# Patient Record
Sex: Female | Born: 1965 | Race: White | Hispanic: No | Marital: Married | Smoking: Current every day smoker
Health system: Southern US, Community
[De-identification: ages and names within clinical notes are randomized; demographics above are authoritative.]

## PROBLEM LIST (undated history)

## (undated) DIAGNOSIS — M539 Dorsopathy, unspecified: Secondary | ICD-10-CM

## (undated) DIAGNOSIS — M318 Other specified necrotizing vasculopathies: Secondary | ICD-10-CM

## (undated) DIAGNOSIS — T5691XA Toxic effect of unspecified metal, accidental (unintentional), initial encounter: Secondary | ICD-10-CM

## (undated) DIAGNOSIS — M797 Fibromyalgia: Secondary | ICD-10-CM

## (undated) DIAGNOSIS — I7300 Raynaud's syndrome without gangrene: Secondary | ICD-10-CM

## (undated) DIAGNOSIS — E079 Disorder of thyroid, unspecified: Secondary | ICD-10-CM

## (undated) HISTORY — DX: Disorder of thyroid, unspecified: E07.9

## (undated) HISTORY — PX: APPENDECTOMY: SHX54

## (undated) HISTORY — DX: Fibromyalgia: M79.7

## (undated) HISTORY — DX: Toxic effect of unspecified metal, accidental (unintentional), initial encounter: T56.91XA

## (undated) HISTORY — PX: ABDOMINAL HYSTERECTOMY: SHX81

## (undated) HISTORY — DX: Raynaud's syndrome without gangrene: I73.00

## (undated) HISTORY — DX: Dorsopathy, unspecified: M53.9

## (undated) HISTORY — DX: Other specified necrotizing vasculopathies: M31.8

---

## 2000-02-13 HISTORY — PX: CYST EXCISION: SHX5701

## 2012-02-13 HISTORY — PX: CYST EXCISION: SHX5701

## 2016-08-27 ENCOUNTER — Encounter: Payer: Self-pay | Admitting: Adult Health

## 2016-08-27 ENCOUNTER — Ambulatory Visit (INDEPENDENT_AMBULATORY_CARE_PROVIDER_SITE_OTHER): Payer: Managed Care, Other (non HMO) | Admitting: Adult Health

## 2016-08-27 VITALS — BP 143/79 | HR 59 | Ht 67.5 in | Wt 162.0 lb

## 2016-08-27 DIAGNOSIS — T5691XA Toxic effect of unspecified metal, accidental (unintentional), initial encounter: Secondary | ICD-10-CM

## 2016-08-27 DIAGNOSIS — R5383 Other fatigue: Secondary | ICD-10-CM | POA: Diagnosis not present

## 2016-08-27 DIAGNOSIS — E039 Hypothyroidism, unspecified: Secondary | ICD-10-CM | POA: Diagnosis not present

## 2016-08-27 DIAGNOSIS — Z0000 Encounter for general adult medical examination without abnormal findings: Secondary | ICD-10-CM

## 2016-08-27 DIAGNOSIS — M797 Fibromyalgia: Secondary | ICD-10-CM

## 2016-08-27 NOTE — Assessment & Plan Note (Addendum)
Increase water intake Follow Heart Healthy Diet Increase daily walking. Suggest daily stretching/yoga to help with pain and stiffness. Continue to reduce-stop tobacco use (currently 5 cigarettes/day). Please schedule CPE with fasting labs within 4-6 weeks.

## 2016-08-27 NOTE — Assessment & Plan Note (Signed)
She reports taking Levoxyl 100mcg daily until Oct 2017. TSH level checked today. Will re-start med once level results.

## 2016-08-27 NOTE — Assessment & Plan Note (Signed)
TSH and Vit D levels checked today.

## 2016-08-27 NOTE — Progress Notes (Signed)
Subjective:    Patient ID: Morgan Wong, female    DOB: 10-19-1965, 51 y.o.   MRN: [id number]  HPI:  [name] is here to establish as a new pt.  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  She developed severe fatigue, confusion, ultimately dx'd with Heavy Metal Poisoning 2004 and treated with "a special mixture of fluids" by physician in [place] (who per pt is "probably dead by now").  She developed fibromyalgia and was treated with strong, long-term course of narcotics which caused "my tooth to rot out".  She has been on disability since 2012/2013 ? due to heavy metal poisoning, systematic vasculitis, fibromyalgia, and cervical disk compression.  She also reports hx of palpitations and was seen by cards-however is unsure of provider and when/where she was treated.  She has hypothyroidism treated with Levoxyl 100mcg daily, last dose was Oct 2017-due to financial constraints.  Her son lives at home and has primary pulmonary HTN-which his treatment consumes a large portion of household budget.  She lived in [place] for most of her life, then [place] to [place] years ago and then recently re-located to [place] area. She is trying to stop smoking, down to just 5 cigarettes/day-GREAT!    Patient Care Team    Relationship Specialty Notifications Start End  [name], Kemo Spruce D, NP PCP - General Family Medicine  08/21/16     Patient Active Problem List   Diagnosis Date Noted  . Fibromyalgia 08/28/2016  . Hypothyroidism 08/27/2016  . Fatigue 08/27/2016  . Healthcare maintenance 08/27/2016     Past Medical History:  Diagnosis Date  . Fibromyalgia   . Heavy metal poisoning   . Multilevel degenerative disc disease   . Raynaud disease   . Systemic vasculitis (HCC)   . Thyroid disease      Past Surgical History:  Procedure Laterality Date  . ABDOMINAL HYSTERECTOMY    . APPENDECTOMY    . CYST EXCISION  2014  . CYST EXCISION  2002   removed  from behind ear     Family History  Problem Relation Age of Onset  . Hypertension Mother   . Heart disease Mother   . Lumbar disc disease Mother   . Emphysema Mother   . Liver cancer Father   . Lung cancer Father   . Heart disease Father   . Coronary artery disease Sister   . COPD Sister   . Hypertension Sister   . Hypertension Brother   . Pulmonary Hypertension Son   . Pulmonary Hypertension Maternal Uncle   . Hypertension Maternal Grandmother   . Bladder Cancer Maternal Grandmother   . Thyroid disease Maternal Grandmother   . Colon cancer Paternal Grandfather   . Cancer Paternal Grandfather   . Emphysema Paternal Grandfather      History  Drug Use No     History  Alcohol Use  . Yes    Comment: yearly     History  Smoking Status  . Current Every Day Smoker  . Types: Cigarettes  . Start date: 08/27/1981  Smokeless Tobacco  . Never Used     Outpatient Encounter Prescriptions as of 08/27/2016  Medication Sig  . ibuprofen (ADVIL,MOTRIN) 200 MG tablet Take 200 mg by mouth every 6 (six) hours as needed.  . Vitamin D, Cholecalciferol, 1000 units TABS Take 3 Units by mouth.   No facility-administered encounter medications on file as of 08/27/2016.  Allergies: Amoxicillin; Penicillins; Other; Procardia [nifedipine]; and Oxycodone-acetaminophen  Body mass index is 25 kg/m.  Blood pressure (!) 143/79, pulse (!) 59, height 5' 7.5" (1.715 m), weight 162 lb (73.5 kg).    Review of Systems  Constitutional: Positive for fatigue. Negative for activity change, appetite change, chills, diaphoresis, fever and unexpected weight change.  HENT: Negative for congestion.   Eyes: Negative for visual disturbance.  Respiratory: Negative for cough, chest tightness, shortness of breath, wheezing and stridor.   Cardiovascular: Negative for chest pain, palpitations and leg swelling.  Gastrointestinal: Negative for abdominal distention, abdominal pain, blood in stool,  constipation, diarrhea, nausea and vomiting.  Endocrine: Negative for cold intolerance, heat intolerance, polydipsia, polyphagia and polyuria.  Genitourinary: Negative for difficulty urinating, flank pain and hematuria.  Musculoskeletal: Positive for arthralgias, back pain, gait problem, joint swelling, myalgias, neck pain and neck stiffness.  Skin: Negative for color change, pallor, rash and wound.  Neurological: Positive for weakness and headaches. Negative for dizziness and tremors.  Hematological: Does not bruise/bleed easily.  Psychiatric/Behavioral: Positive for sleep disturbance. Negative for dysphoric mood. The patient is not nervous/anxious.        Objective:   Physical Exam  Constitutional: She is oriented to person, place, and time. She appears well-developed and well-nourished. No distress.  HENT:  Head: Normocephalic and atraumatic.  Right Ear: External ear normal.  Left Ear: External ear normal.  Eyes: Pupils are equal, round, and reactive to light. Conjunctivae are normal.  Neck: Normal range of motion. Neck supple.  Cardiovascular: Normal rate, regular rhythm, normal heart sounds and intact distal pulses.   Pulmonary/Chest: Effort normal and breath sounds normal. No respiratory distress. She has no wheezes. She has no rales. She exhibits no tenderness.  Musculoskeletal:  Very stiff when rising from seated position. Slow, steady, guarded ambulation. She holds onto furniture when changing positions.     Lymphadenopathy:    She has no cervical adenopathy.  Neurological: She is alert and oriented to person, place, and time.  Skin: Skin is warm and dry. No rash noted. She is not diaphoretic. No erythema. No pallor.  Psychiatric: She has a normal mood and affect. Her behavior is normal. Judgment and thought content normal.          Assessment & Plan:   1. Healthcare maintenance   2. Hypothyroidism, unspecified type   3. Fatigue, unspecified type   4. Fibromyalgia      Hypothyroidism She reports taking Levoxyl daily until Oct 2017. TSH level checked today. Will re-start med once level results.  Fatigue TSH and Vit D levels checked today.  Healthcare maintenance Increase water intake Follow Heart Healthy Diet Increase daily walking. Suggest daily stretching/yoga to help with pain and stiffness. Continue to reduce-stop tobacco use (currently 5 cigarettes/day). Please schedule CPE with fasting labs within 4-6 weeks.   Fibromyalgia Treated with narcotics several years ago-[place] Controlled Substance Database verified and she has not been on opioids since moving to [place].     FOLLOW-UP:  Return in about 3 months (around 11/27/2016) for Regular Follow Up, CPE.

## 2016-08-27 NOTE — Patient Instructions (Addendum)
Heart-Healthy Eating Plan Many factors influence your heart health, including eating and exercise habits. Heart (coronary) risk increases with abnormal blood fat (lipid) levels. Heart-healthy meal planning includes limiting unhealthy fats, increasing healthy fats, and making other small dietary changes. This includes maintaining a healthy body weight to help keep lipid levels within a normal range. What is my plan? Your health care provider recommends that you:  Get no more than _________% of the total calories in your daily diet from fat.  Limit your intake of saturated fat to less than _________% of your total calories each day.  Limit the amount of cholesterol in your diet to less than _________ mg per day.  What types of fat should I choose?  Choose healthy fats more often. Choose monounsaturated and polyunsaturated fats, such as olive oil and canola oil, flaxseeds, walnuts, almonds, and seeds.  Eat more omega-3 fats. Good choices include salmon, mackerel, sardines, tuna, flaxseed oil, and ground flaxseeds. Aim to eat fish at least two times each week.  Limit saturated fats. Saturated fats are primarily found in animal products, such as meats, butter, and cream. Plant sources of saturated fats include palm oil, palm kernel oil, and coconut oil.  Avoid foods with partially hydrogenated oils in them. These contain trans fats. Examples of foods that contain trans fats are stick margarine, some tub margarines, cookies, crackers, and other baked goods. What general guidelines do I need to follow?  Check food labels carefully to identify foods with trans fats or high amounts of saturated fat.  Fill one half of your plate with vegetables and green salads. Eat 4-5 servings of vegetables per day. A serving of vegetables equals 1 cup of raw leafy vegetables,  cup of raw or cooked cut-up vegetables, or  cup of vegetable juice.  Fill one fourth of your plate with whole grains. Look for the word  "whole" as the first word in the ingredient list.  Fill one fourth of your plate with lean protein foods.  Eat 4-5 servings of fruit per day. A serving of fruit equals one medium whole fruit,  cup of dried fruit,  cup of fresh, frozen, or canned fruit, or  cup of 100% fruit juice.  Eat more foods that contain soluble fiber. Examples of foods that contain this type of fiber are apples, broccoli, carrots, beans, peas, and barley. Aim to get 20-30 g of fiber per day.  Eat more home-cooked food and less restaurant, buffet, and fast food.  Limit or avoid alcohol.  Limit foods that are high in starch and sugar.  Avoid fried foods.  Cook foods by using methods other than frying. Baking, boiling, grilling, and broiling are all great options. Other fat-reducing suggestions include: ? Removing the skin from poultry. ? Removing all visible fats from meats. ? Skimming the fat off of stews, soups, and gravies before serving them. ? Steaming vegetables in water or broth.  Lose weight if you are overweight. Losing just 5-10% of your initial body weight can help your overall health and prevent diseases such as diabetes and heart disease.  Increase your consumption of nuts, legumes, and seeds to 4-5 servings per week. One serving of dried beans or legumes equals  cup after being cooked, one serving of nuts equals 1 ounces, and one serving of seeds equals  ounce or 1 tablespoon.  You may need to monitor your salt (sodium) intake, especially if you have high blood pressure. Talk with your health care provider or dietitian to get  more information about reducing sodium. What foods can I eat? Grains  Breads, including French, white, pita, wheat, raisin, rye, oatmeal, and Italian. Tortillas that are neither fried nor made with lard or trans fat. Low-fat rolls, including hotdog and hamburger buns and English muffins. Biscuits. Muffins. Waffles. Pancakes. Light popcorn. Whole-grain cereals. Flatbread.  Melba toast. Pretzels. Breadsticks. Rusks. Low-fat snacks and crackers, including oyster, saltine, matzo, graham, animal, and rye. Rice and pasta, including brown rice and those that are made with whole wheat. Vegetables All vegetables. Fruits All fruits, but limit coconut. Meats and Other Protein Sources Lean, well-trimmed beef, veal, pork, and lamb. Chicken and turkey without skin. All fish and shellfish. Wild duck, rabbit, pheasant, and venison. Egg whites or low-cholesterol egg substitutes. Dried beans, peas, lentils, and tofu.Seeds and most nuts. Dairy Low-fat or nonfat cheeses, including ricotta, string, and mozzarella. Skim or 1% milk that is liquid, powdered, or evaporated. Buttermilk that is made with low-fat milk. Nonfat or low-fat yogurt. Beverages Mineral water. Diet carbonated beverages. Sweets and Desserts Sherbets and fruit ices. Honey, jam, marmalade, jelly, and syrups. Meringues and gelatins. Pure sugar candy, such as hard candy, jelly beans, gumdrops, mints, marshmallows, and small amounts of dark chocolate. Angel food cake. Eat all sweets and desserts in moderation. Fats and Oils Nonhydrogenated (trans-free) margarines. Vegetable oils, including soybean, sesame, sunflower, olive, peanut, safflower, corn, canola, and cottonseed. Salad dressings or mayonnaise that are made with a vegetable oil. Limit added fats and oils that you use for cooking, baking, salads, and as spreads. Other Cocoa powder. Coffee and tea. All seasonings and condiments. The items listed above may not be a complete list of recommended foods or beverages. Contact your dietitian for more options. What foods are not recommended? Grains Breads that are made with saturated or trans fats, oils, or whole milk. Croissants. Butter rolls. Cheese breads. Sweet rolls. Donuts. Buttered popcorn. Chow mein noodles. High-fat crackers, such as cheese or butter crackers. Meats and Other Protein Sources Fatty meats, such  as hotdogs, short ribs, sausage, spareribs, bacon, ribeye roast or steak, and mutton. High-fat deli meats, such as salami and bologna. Caviar. Domestic duck and goose. Organ meats, such as kidney, liver, sweetbreads, brains, gizzard, chitterlings, and heart. Dairy Cream, sour cream, cream cheese, and creamed cottage cheese. Whole milk cheeses, including blue (bleu), Monterey Jack, Brie, Colby, American, Havarti, Swiss, cheddar, Camembert, and [place]. Whole or 2% milk that is liquid, evaporated, or condensed. Whole buttermilk. Cream sauce or high-fat cheese sauce. Yogurt that is made from whole milk. Beverages Regular sodas and drinks with added sugar. Sweets and Desserts Frosting. Pudding. Cookies. Cakes other than angel food cake. Candy that has milk chocolate or white chocolate, hydrogenated fat, butter, coconut, or unknown ingredients. Buttered syrups. Full-fat ice cream or ice cream drinks. Fats and Oils Gravy that has suet, meat fat, or shortening. Cocoa butter, hydrogenated oils, palm oil, coconut oil, palm kernel oil. These can often be found in baked products, candy, fried foods, nondairy creamers, and whipped toppings. Solid fats and shortenings, including bacon fat, salt pork, lard, and butter. Nondairy cream substitutes, such as coffee creamers and sour cream substitutes. Salad dressings that are made of unknown oils, cheese, or sour cream. The items listed above may not be a complete list of foods and beverages to avoid. Contact your dietitian for more information. This information is not intended to replace advice given to you by your health care provider. Make sure you discuss any questions you have with your health care   provider. Document Released: 11/08/2007 Document Revised: 08/19/2015 Document Reviewed: 07/23/2013 Elsevier Interactive Patient Education  2017 Elsevier Inc.   Hypothyroidism Hypothyroidism is a disorder of the thyroid. The thyroid is a large gland that is located in  the lower front of the neck. The thyroid releases hormones that control how the body works. With hypothyroidism, the thyroid does not make enough of these hormones. What are the causes? Causes of hypothyroidism may include:  Viral infections.  Pregnancy.  Your own defense system (immune system) attacking your thyroid.  Certain medicines.  Birth defects.  Past radiation treatments to your head or neck.  Past treatment with radioactive iodine.  Past surgical removal of part or all of your thyroid.  Problems with the gland that is located in the center of your brain (pituitary).  What are the signs or symptoms? Signs and symptoms of hypothyroidism may include:  Feeling as though you have no energy (lethargy).  Inability to tolerate cold.  Weight gain that is not explained by a change in diet or exercise habits.  Dry skin.  Coarse hair.  Menstrual irregularity.  Slowing of thought processes.  Constipation.  Sadness or depression.  How is this diagnosed? Your health care provider may diagnose hypothyroidism with blood tests and ultrasound tests. How is this treated? Hypothyroidism is treated with medicine that replaces the hormones that your body does not make. After you begin treatment, it may take several weeks for symptoms to go away. Follow these instructions at home:  Take medicines only as directed by your health care provider.  If you start taking any new medicines, tell your health care provider.  Keep all follow-up visits as directed by your health care provider. This is important. As your condition improves, your dosage needs may change. You will need to have blood tests regularly so that your health care provider can watch your condition. Contact a health care provider if:  Your symptoms do not get better with treatment.  You are taking thyroid replacement medicine and: ? You sweat excessively. ? You have tremors. ? You feel anxious. ? You lose  weight rapidly. ? You cannot tolerate heat. ? You have emotional swings. ? You have diarrhea. ? You feel weak. Get help right away if:  You develop chest pain.  You develop an irregular heartbeat.  You develop a rapid heartbeat. This information is not intended to replace advice given to you by your health care provider. Make sure you discuss any questions you have with your health care provider. Document Released: 01/29/2005 Document Revised: 07/07/2015 Document Reviewed: 06/16/2013 Elsevier Interactive Patient Education  2017 Elsevier Inc.    Once TSH and Vit D level results are available we will call. Will re-start Levoxyl once TSH level basline obtained. Follow heart healthy diet and continue daily walking. Continue to reduce tobacco use. Please schedule CPE with labs in 4-6 weeks, labs: fasting and TSH. WELCOME TO THE PRACTICE!

## 2016-08-28 DIAGNOSIS — R030 Elevated blood-pressure reading, without diagnosis of hypertension: Secondary | ICD-10-CM | POA: Insufficient documentation

## 2016-08-28 DIAGNOSIS — M797 Fibromyalgia: Secondary | ICD-10-CM | POA: Insufficient documentation

## 2016-08-28 DIAGNOSIS — T5691XA Toxic effect of unspecified metal, accidental (unintentional), initial encounter: Secondary | ICD-10-CM | POA: Insufficient documentation

## 2016-08-28 NOTE — Assessment & Plan Note (Signed)
Treated with narcotics several years ago-[place] Controlled Substance Database verified and she has not been on opioids since moving to [place].

## 2016-08-28 NOTE — Assessment & Plan Note (Signed)
Per pt hx.   Awaiting medical records from [place].

## 2016-08-29 ENCOUNTER — Other Ambulatory Visit: Payer: Self-pay | Admitting: Adult Health

## 2016-08-29 DIAGNOSIS — E039 Hypothyroidism, unspecified: Secondary | ICD-10-CM

## 2016-08-29 LAB — HEMOGLOBIN A1C
ESTIMATED AVERAGE GLUCOSE: 123 mg/dL
HEMOGLOBIN A1C: 5.9 % — AB (ref 4.8–5.6)

## 2016-08-29 LAB — VITAMIN D 25 HYDROXY (VIT D DEFICIENCY, FRACTURES): Vit D, 25-Hydroxy: 34.2 ng/mL (ref 30.0–100.0)

## 2016-08-29 LAB — TSH: TSH: 11.53 u[IU]/mL — ABNORMAL HIGH (ref 0.450–4.500)

## 2016-08-29 MED ORDER — LEVOTHYROXINE SODIUM 100 MCG PO TABS: 100.0000 ug | ORAL_TABLET | Freq: Every day | ORAL | 3 refills | Status: DC

## 2016-08-30 ENCOUNTER — Telehealth: Payer: Self-pay

## 2016-08-30 DIAGNOSIS — E039 Hypothyroidism, unspecified: Secondary | ICD-10-CM

## 2016-08-30 DIAGNOSIS — T5691XA Toxic effect of unspecified metal, accidental (unintentional), initial encounter: Secondary | ICD-10-CM

## 2016-08-30 DIAGNOSIS — Z0000 Encounter for general adult medical examination without abnormal findings: Secondary | ICD-10-CM

## 2016-08-30 DIAGNOSIS — M797 Fibromyalgia: Secondary | ICD-10-CM

## 2016-08-30 DIAGNOSIS — R5383 Other fatigue: Secondary | ICD-10-CM

## 2016-08-30 NOTE — Telephone Encounter (Signed)
Informed patient of lab results and recommendations.  Lab orders placed.

## 2016-08-30 NOTE — Telephone Encounter (Signed)
-----   Message from Julaine FusiKaty D Danford, NP sent at 08/29/2016  3:27 PM EDT ----- Afternoon, Please call Ms. Gillott and share that her A1c is 5.9-which is pre-diabetic and she needs to increase daily walking and reduce CHO/sugar intake. Vit d normal 34.2 TSH 11.530, re-started Levoxyl 100mcg. Will need to re-check TSH in 4-6 weeks. Thanks! Orpha BurKaty

## 2016-10-11 ENCOUNTER — Other Ambulatory Visit: Payer: Managed Care, Other (non HMO)

## 2016-10-17 ENCOUNTER — Encounter: Payer: Managed Care, Other (non HMO) | Admitting: Adult Health

## 2017-04-18 ENCOUNTER — Ambulatory Visit (HOSPITAL_COMMUNITY)
Admission: EM | Admit: 2017-04-18 | Discharge: 2017-04-18 | Disposition: A | Discharge status: Home / Self Care | Payer: Managed Care, Other (non HMO) | Attending: Family Medicine | Admitting: Family Medicine

## 2017-04-18 ENCOUNTER — Encounter (HOSPITAL_COMMUNITY): Payer: Self-pay | Admitting: Emergency Medicine

## 2017-04-18 ENCOUNTER — Other Ambulatory Visit: Payer: Self-pay

## 2017-04-18 DIAGNOSIS — J320 Chronic maxillary sinusitis: Secondary | ICD-10-CM

## 2017-04-18 DIAGNOSIS — J40 Bronchitis, not specified as acute or chronic: Secondary | ICD-10-CM

## 2017-04-18 MED ORDER — LEVOFLOXACIN 500 MG PO TABS: 500.0000 mg | ORAL_TABLET | Freq: Every day | ORAL | 0 refills | Status: DC

## 2017-04-18 MED ORDER — CIPROFLOXACIN HCL 500 MG PO TABS
ORAL_TABLET | ORAL | Status: AC
  Filled 2017-04-18: qty 1

## 2017-04-18 MED ORDER — HYDROCOD POLST-CPM POLST ER 10-8 MG/5ML PO SUER: 5.0000 mL | Freq: Two times a day (BID) | ORAL | 0 refills | Status: DC | PRN

## 2017-04-18 MED ORDER — CIPROFLOXACIN HCL 500 MG PO TABS
500.0000 mg | ORAL_TABLET | Freq: Once | ORAL | Status: AC
  Administered 2017-04-18: 500 mg via ORAL

## 2017-04-18 NOTE — ED Provider Notes (Signed)
[facility]   [id number] 04/18/17 Arrival Time: 1632   SUBJECTIVE:  Morgan Wong is a 52 y.o. female who presents to the urgent care with complaint of coughing, headaches, chest soreness, throat burning, generalized body aches with some nausea.  Onset of symptoms was 6 weeks ago as a flu-like illness.  She continues to suffer painful cough with productive mucous, sinus pressure, and sensitive skin.  No N,V, D in weeks.   Past Medical History:  Diagnosis Date  . Fibromyalgia   . Heavy metal poisoning   . Multilevel degenerative disc disease   . Raynaud disease   . Systemic vasculitis (HCC)   . Thyroid disease    Family History  Problem Relation Age of Onset  . Hypertension Mother   . Heart disease Mother   . Lumbar disc disease Mother   . Emphysema Mother   . Liver cancer Father   . Lung cancer Father   . Heart disease Father   . Coronary artery disease Sister   . COPD Sister   . Hypertension Sister   . Hypertension Brother   . Pulmonary Hypertension Son   . Pulmonary Hypertension Maternal Uncle   . Hypertension Maternal Grandmother   . Bladder Cancer Maternal Grandmother   . Thyroid disease Maternal Grandmother   . Colon cancer Paternal Grandfather   . Cancer Paternal Grandfather   . Emphysema Paternal Grandfather    Social History   Socioeconomic History  . Marital status: Married    Spouse name: Not on file  . Number of children: Not on file  . Years of education: Not on file  . Highest education level: Not on file  Social Needs  . Financial resource strain: Not on file  . Food insecurity - worry: Not on file  . Food insecurity - inability: Not on file  . Transportation needs - medical: Not on file  . Transportation needs - non-medical: Not on file  Occupational History  . Not on file  Tobacco Use  . Smoking status: Current Every Day Smoker    Types: Cigarettes    Start date: 08/27/1981  . Smokeless tobacco: Never Used  Substance and Sexual  Activity  . Alcohol use: Yes    Comment: yearly  . Drug use: No  . Sexual activity: Yes    Partners: Male    Birth control/protection: None  Other Topics Concern  . Not on file  Social History Narrative  . Not on file   No outpatient medications have been marked as taking for the 04/18/17 encounter [facility] Encounter).   Allergies  Allergen Reactions  . Amoxicillin Anaphylaxis    Cannot take any "Cillin" drugs  . Penicillins Anaphylaxis  . Other     Vicryl/Monocryl Infection like feeling/burning/fever  . Procardia [Nifedipine] Swelling  . Oxycodone-Acetaminophen       ROS: As per HPI, remainder of ROS negative.   OBJECTIVE:   Vitals:   04/18/17 1711  BP: (!) 161/96  Pulse: 73  Resp: 18  Temp: 98.5 F (36.9 C)  TempSrc: Oral  SpO2: 100%     General appearance: alert; no distress Eyes: PERRL; EOMI; conjunctiva normal HENT: normocephalic; atraumatic; nasal voice; oral mucosa normal Neck: supple Lungs:coarse BS on auscultation bilaterally Heart: regular rate and rhythm Back: no CVA tenderness Extremities: no cyanosis or edema; symmetrical with no gross deformities Skin: warm and dry Neurologic: normal gait; grossly normal Psychological: alert and cooperative; normal mood and affect      Labs:  Results for orders placed or performed in visit on 08/27/16  Hemoglobin A1c  Result Value Ref Range   Hgb A1c MFr Bld 5.9 (H) 4.8 - 5.6 %   Est. average glucose Bld gHb Est-mCnc 123 mg/dL  VITAMIN D 25 Hydroxy (Vit-D Deficiency, Fractures)  Result Value Ref Range   Vit D, 25-Hydroxy 34.2 30.0 - 100.0 ng/mL  TSH  Result Value Ref Range   TSH 11.530 (H) 0.450 - 4.500 uIU/mL    Labs Reviewed - No data to display  No results found.     ASSESSMENT & PLAN:  1. Bronchitis   2. Sinusitis, maxillary, chronic   patient's persistent symptoms and sinus voice suggest chronic sinusitis along with bronchitis.  Meds ordered this encounter  Medications  .  levofloxacin (LEVAQUIN) 500 MG tablet    Sig: Take 1 tablet (500 mg total) by mouth daily.    Dispense:  10 tablet    Refill:  0  . chlorpheniramine-HYDROcodone (TUSSIONEX PENNKINETIC ER) 10-8 MG/5ML SUER    Sig: Take 5 mLs by mouth every 12 (twelve) hours as needed for cough.    Dispense:  80 mL    Refill:  0  . ciprofloxacin (CIPRO) tablet 500 mg    Reviewed expectations re: course of current medical issues. Questions answered. Outlined signs and symptoms indicating need for more acute intervention. Patient verbalized understanding. After Visit Summary given.     Elvina SidleLauenstein, Georgette Helmer, MD 04/18/17 Flossie [id number]

## 2017-04-18 NOTE — ED Triage Notes (Signed)
Pt c/o coughing, headaches, chest soreness, throat burning, generalized body aches with some nausea

## 2017-05-01 ENCOUNTER — Other Ambulatory Visit: Payer: Self-pay

## 2017-05-01 ENCOUNTER — Ambulatory Visit (HOSPITAL_COMMUNITY)
Admission: EM | Admit: 2017-05-01 | Discharge: 2017-05-01 | Disposition: A | Discharge status: Home / Self Care | Payer: Managed Care, Other (non HMO) | Attending: Family Medicine | Admitting: Family Medicine

## 2017-05-01 ENCOUNTER — Encounter (HOSPITAL_COMMUNITY): Payer: Self-pay | Admitting: Emergency Medicine

## 2017-05-01 DIAGNOSIS — F17210 Nicotine dependence, cigarettes, uncomplicated: Secondary | ICD-10-CM | POA: Insufficient documentation

## 2017-05-01 DIAGNOSIS — M5431 Sciatica, right side: Secondary | ICD-10-CM | POA: Diagnosis not present

## 2017-05-01 DIAGNOSIS — E039 Hypothyroidism, unspecified: Secondary | ICD-10-CM | POA: Insufficient documentation

## 2017-05-01 DIAGNOSIS — M5137 Other intervertebral disc degeneration, lumbosacral region: Secondary | ICD-10-CM | POA: Diagnosis not present

## 2017-05-01 DIAGNOSIS — Z880 Allergy status to penicillin: Secondary | ICD-10-CM | POA: Insufficient documentation

## 2017-05-01 DIAGNOSIS — R52 Pain, unspecified: Secondary | ICD-10-CM | POA: Diagnosis present

## 2017-05-01 DIAGNOSIS — Z79899 Other long term (current) drug therapy: Secondary | ICD-10-CM | POA: Insufficient documentation

## 2017-05-01 DIAGNOSIS — M797 Fibromyalgia: Secondary | ICD-10-CM | POA: Diagnosis not present

## 2017-05-01 DIAGNOSIS — M5441 Lumbago with sciatica, right side: Secondary | ICD-10-CM | POA: Diagnosis not present

## 2017-05-01 DIAGNOSIS — I7300 Raynaud's syndrome without gangrene: Secondary | ICD-10-CM | POA: Insufficient documentation

## 2017-05-01 DIAGNOSIS — M791 Myalgia, unspecified site: Secondary | ICD-10-CM | POA: Diagnosis not present

## 2017-05-01 LAB — POCT URINALYSIS DIP (DEVICE)
BILIRUBIN URINE: NEGATIVE
Glucose, UA: NEGATIVE mg/dL
Ketones, ur: NEGATIVE mg/dL
LEUKOCYTES UA: NEGATIVE
NITRITE: NEGATIVE
PH: 5.5 (ref 5.0–8.0)
Protein, ur: NEGATIVE mg/dL
Specific Gravity, Urine: <=1.005 (ref 1.005–1.030)
Urobilinogen, UA: 0.2 mg/dL (ref 0.0–1.0)

## 2017-05-01 LAB — POCT I-STAT, CHEM 8
BUN: 11 mg/dL (ref 6–20)
CALCIUM ION: 1.19 mmol/L (ref 1.15–1.40)
CHLORIDE: 103 mmol/L (ref 101–111)
Creatinine, Ser: 0.9 mg/dL (ref 0.44–1.00)
Glucose, Bld: 122 mg/dL — ABNORMAL HIGH (ref 65–99)
HCT: 41 % (ref 36.0–46.0)
HEMOGLOBIN: 13.9 g/dL (ref 12.0–15.0)
Potassium: 3.8 mmol/L (ref 3.5–5.1)
SODIUM: 143 mmol/L (ref 135–145)
TCO2: 29 mmol/L (ref 22–32)

## 2017-05-01 LAB — CK: Total CK: 55 U/L (ref 38–234)

## 2017-05-01 MED ORDER — PREDNISONE 10 MG (21) PO TBPK: ORAL_TABLET | Freq: Every day | ORAL | 0 refills | Status: DC

## 2017-05-01 MED ORDER — KETOROLAC TROMETHAMINE 60 MG/2ML IM SOLN
60.0000 mg | Freq: Once | INTRAMUSCULAR | Status: AC
  Administered 2017-05-01: 60 mg via INTRAMUSCULAR

## 2017-05-01 MED ORDER — CYCLOBENZAPRINE HCL 10 MG PO TABS: 5.0000 mg | ORAL_TABLET | Freq: Every evening | ORAL | 0 refills | Status: DC | PRN

## 2017-05-01 MED ORDER — KETOROLAC TROMETHAMINE 60 MG/2ML IM SOLN
INTRAMUSCULAR | Status: AC
  Filled 2017-05-01: qty 2

## 2017-05-01 MED ORDER — METHYLPREDNISOLONE SODIUM SUCC 125 MG IJ SOLR
INTRAMUSCULAR | Status: AC
  Filled 2017-05-01: qty 2

## 2017-05-01 MED ORDER — METHYLPREDNISOLONE SODIUM SUCC 125 MG IJ SOLR
125.0000 mg | Freq: Once | INTRAMUSCULAR | Status: AC
  Administered 2017-05-01: 125 mg via INTRAMUSCULAR

## 2017-05-01 MED ORDER — MELOXICAM 7.5 MG PO TABS: 7.5000 mg | ORAL_TABLET | Freq: Every day | ORAL | 0 refills | Status: DC

## 2017-05-01 NOTE — Discharge Instructions (Signed)
Toradol and Solu-Medrol injection in office today.  Start Mobic as directed.  Prednisone as directed. Flexeril as needed at night. Flexeril can make you drowsy, so do not take if you are going to drive, operate heavy machinery, or make important decisions. Ice/heat compresses as needed.  Other lab work for results tomorrow, you will receive a call from us if anything is abnormal. Follow up here or with PCP if symptoms worsen, changes for reevaluation. If experience numbness/tingling of the inner thighs, loss of bladder or bowel control, go to the emergency department for evaluation.

## 2017-05-01 NOTE — ED Triage Notes (Addendum)
Patient has body aches that are different from when she came in to the department on 3/7.  Patient describes today's pain as significant.  Patient asking if this is a side effect of medication -levofloxacin

## 2017-05-01 NOTE — ED Notes (Signed)
[place] back [place], [place] . Given information CK was normal, no muscle breakdown. Continue on medication. Pt. Stated, Thank you

## 2017-05-01 NOTE — ED Provider Notes (Signed)
[facility]    CSN: [id number] Arrival date & time: 05/01/17  1726     History   Chief Complaint Chief Complaint  Patient presents with  . Pain    HPI Morgan Wong is a 52 y.o. female.   52 year old female comes in for worsening body aches.  She was seen here 04/18/2017, was given Levaquin for bronchitis.  States after a few days, started noticing generalized muscle soreness that is different from prior complaint of body ache.  Few days later, noticed some discoloration of her urine, and has now since resolved.  States that she was taking Tussionex, that caused her to be constipated.  States she has had straining bowel movements for the past 5 days, wonders if that triggered her back pain.  States she has history of degenerative disc disease from L5-S1, and since straining for bowel movements, she has had bilateral low back pain with radiation down the right leg.  Denies new injury/trauma.  Denies numbness, tingling, loss of bladder or bowel control.      Past Medical History:  Diagnosis Date  . Fibromyalgia   . Heavy metal poisoning   . Multilevel degenerative disc disease   . Raynaud disease   . Systemic vasculitis (HCC)   . Thyroid disease     Patient Active Problem List   Diagnosis Date Noted  . Fibromyalgia 08/28/2016  . Heavy metal poisoning 08/28/2016  . Hypothyroidism 08/27/2016  . Fatigue 08/27/2016  . Healthcare maintenance 08/27/2016    Past Surgical History:  Procedure Laterality Date  . ABDOMINAL HYSTERECTOMY    . APPENDECTOMY    . CYST EXCISION  2014  . CYST EXCISION  2002   removed from behind ear    OB History    No data available       Home Medications    Prior to Admission medications   Medication Sig Start Date End Date Taking? Authorizing Provider  cyclobenzaprine (FLEXERIL) 10 MG tablet Take 0.5-1 tablets (5-10 mg total) by mouth at bedtime as needed for muscle spasms. 05/01/17   Cathie Hoops, Amy V, PA-C  ibuprofen (ADVIL,MOTRIN)  200 MG tablet Take 200 mg by mouth every 6 (six) hours as needed.    [provider]  levothyroxine (SYNTHROID, LEVOTHROID) 100 MCG tablet Take 1 tablet (100 mcg total) by mouth daily. 08/29/16   Danford, Orpha Bur D, NP  meloxicam (MOBIC) 7.5 MG tablet Take 1 tablet (7.5 mg total) by mouth daily. 05/01/17   Cathie Hoops, Amy V, PA-C  predniSONE (STERAPRED UNI-PAK 21 TAB) 10 MG (21) TBPK tablet Take by mouth daily. Take 6 tabs by mouth day 1, then 5 tabs, then 4 tabs, then 3 tabs, 2 tabs, then 1 tab for the last day 05/01/17   Belinda Fisher, PA-C  Vitamin D, Cholecalciferol, 1000 units TABS Take 3 Units by mouth.    [provider]    Family History Family History  Problem Relation Age of Onset  . Hypertension Mother   . Heart disease Mother   . Lumbar disc disease Mother   . Emphysema Mother   . Liver cancer Father   . Lung cancer Father   . Heart disease Father   . Coronary artery disease Sister   . COPD Sister   . Hypertension Sister   . Hypertension Brother   . Pulmonary Hypertension Son   . Pulmonary Hypertension Maternal Uncle   . Hypertension Maternal Grandmother   . Bladder Cancer Maternal Grandmother   . Thyroid  disease Maternal Grandmother   . Colon cancer Paternal Grandfather   . Cancer Paternal Grandfather   . Emphysema Paternal Grandfather     Social History Social History   Tobacco Use  . Smoking status: Current Every Day Smoker    Types: Cigarettes    Start date: 08/27/1981  . Smokeless tobacco: Never Used  Substance Use Topics  . Alcohol use: Yes    Comment: yearly  . Drug use: No     Allergies   Amoxicillin; Penicillins; Other; Procardia [nifedipine]; and Oxycodone-acetaminophen   Review of Systems Review of Systems  Reason unable to perform ROS: See HPI as above.     Physical Exam Triage Vital Signs ED Triage Vitals  Enc Vitals Group     BP 05/01/17 1903 (!) 146/78     Pulse Rate 05/01/17 1903 74     Resp 05/01/17 1903 18     Temp 05/01/17  1903 98.2 F (36.8 C)     Temp Source 05/01/17 1903 Oral     SpO2 05/01/17 1903 99 %     Weight --      Height --      Head Circumference --      Peak Flow --      Pain Score 05/01/17 1901 10     Pain Loc --      Pain Edu? --      Excl. in GC? --    No data found.  Updated Vital Signs BP (!) 146/78 (BP Location: Right Arm)   Pulse 74   Temp 98.2 F (36.8 C) (Oral)   Resp 18   SpO2 99%   Physical Exam  Constitutional: She is oriented to person, place, and time. She appears well-developed and well-nourished. No distress.  HENT:  Head: Normocephalic and atraumatic.  Eyes: Conjunctivae are normal. Pupils are equal, round, and reactive to light.  Neck: Normal range of motion. Neck supple.  Cardiovascular: Normal rate, regular rhythm and normal heart sounds. Exam reveals no gallop and no friction rub.  No murmur heard. Pulmonary/Chest: Effort normal and breath sounds normal. No stridor. No respiratory distress. She has no wheezes. She has no rales.  Musculoskeletal:  Diffuse tenderness to palpation of lumbar region.  Decreased active range of motion due to pain.  Full passive range of motion.  Strength deferred.  Positive straight leg raise to the right.  Neurological: She is alert and oriented to person, place, and time.     UC Treatments / Results  Labs (all labs ordered are listed, but only abnormal results are displayed) Labs Reviewed  POCT URINALYSIS DIP (DEVICE) - Abnormal; Notable for the following components:      Result Value   Hgb urine dipstick SMALL (*)    All other components within normal limits  POCT I-STAT, CHEM 8 - Abnormal; Notable for the following components:   Glucose, Bld 122 (*)    All other components within normal limits  CK    EKG  EKG Interpretation None       Radiology No results found.  Procedures Procedures (including critical care time)  Medications Ordered in UC Medications  ketorolac (TORADOL) injection 60 mg (60 mg  Intramuscular Given 05/01/17 2045)  methylPREDNISolone sodium succinate (SOLU-MEDROL) 125 mg/2 mL injection 125 mg (125 mg Intramuscular Given 05/01/17 2045)     Initial Impression / Assessment and Plan / UC Course  I have reviewed the triage vital signs and the nursing notes.  Pertinent labs & imaging results  that were available during my care of the patient were reviewed by me and considered in my medical decision making (see chart for details).    I-STAT without increase in creatinine.  Given worries for rhabdo for muscle soreness and dark urine that resolved, will draw CK, pending results.  Given normal creatinine, will treat back pain with sciatica with NSAIDs.  Toradol and Solu-Medrol injection in office today.  Mobic, prednisone, Flexeril for symptoms.  Return precautions given.  Patient expresses understanding and agrees to plan.  Case discussed with Dr. Tracie Harrier, who agrees to plan.  Final Clinical Impressions(s) / UC Diagnoses   Final diagnoses:  Muscle pain  Acute bilateral low back pain with right-sided sciatica    ED Discharge Orders        Ordered    meloxicam (MOBIC) 7.5 MG tablet  Daily     05/01/17 2026    predniSONE (STERAPRED UNI-PAK 21 TAB) 10 MG (21) TBPK tablet  Daily     05/01/17 2026    cyclobenzaprine (FLEXERIL) 10 MG tablet  At bedtime PRN     05/01/17 2026        Belinda Fisher, PA-C 05/01/17 2054

## 2017-10-04 ENCOUNTER — Ambulatory Visit (HOSPITAL_COMMUNITY)
Admission: EM | Admit: 2017-10-04 | Discharge: 2017-10-04 | Disposition: A | Discharge status: Home / Self Care | Payer: Managed Care, Other (non HMO) | Attending: Family Medicine | Admitting: Family Medicine

## 2017-10-04 ENCOUNTER — Ambulatory Visit (INDEPENDENT_AMBULATORY_CARE_PROVIDER_SITE_OTHER): Payer: Managed Care, Other (non HMO)

## 2017-10-04 ENCOUNTER — Encounter (HOSPITAL_COMMUNITY): Payer: Self-pay | Admitting: Emergency Medicine

## 2017-10-04 DIAGNOSIS — S62662A Nondisplaced fracture of distal phalanx of right middle finger, initial encounter for closed fracture: Secondary | ICD-10-CM

## 2017-10-04 MED ORDER — MELOXICAM 7.5 MG PO TABS: 7.5000 mg | ORAL_TABLET | Freq: Every day | ORAL | 0 refills | Status: DC

## 2017-10-04 MED ORDER — CETIRIZINE HCL 5 MG PO TABS: 5.0000 mg | ORAL_TABLET | Freq: Every day | ORAL | 1 refills | Status: DC

## 2017-10-04 NOTE — Discharge Instructions (Addendum)
It was nice meeting you!!  You have a fracture to the third right middle finger.  We will place a splint on the finger. Please wear this until healing. You can take off to take a shower. We will also give you a sling for your shoulder and right arm. Meloxicam for the pain and inflammation.  For your cough I think this is allergy related. Zyrtec should help with this.  Follow up as needed for continued or worsening symptoms

## 2017-10-04 NOTE — ED Triage Notes (Signed)
Pt states her R hand got tangled up while walking her dogs, pt c/o R hand pain.

## 2017-10-04 NOTE — ED Triage Notes (Signed)
Pt also c/o cough since July 20th.

## 2017-10-07 ENCOUNTER — Encounter (HOSPITAL_COMMUNITY): Payer: Self-pay | Admitting: Family Medicine

## 2017-10-07 NOTE — ED Provider Notes (Signed)
[facility]    CSN: [id number] Arrival date & time: 10/04/17  1200     History   Chief Complaint Chief Complaint  Patient presents with  . Hand Injury  . Cough    HPI Morgan Wong is a 52 y.o. female.   Patient is a 52 year old female with past medical history of fibromyalgia, degenerative disc disease, right now, vasculitis, thyroid disease.  She presents with right finger pain, right arm pain.  This started on Tuesday after her hand became tangled up in a dog leash and arm was jerked backwards.  Since the hand, specifically middle finger, has become more painful, swollen.  She reports pain that radiates up her right arm into her shoulder.  This pain is intermittent.  She has been taking ibuprofen with minimal relief.  She cannot use ice due to her raynaud's. She denies any numbness, tingling to extremity.    Patient also complaining of cough x1 month with drainage in the back of her throat.  She denies any production with the cough, fever, chills, body aches, fatigue.  She denies any ear pain, sore throat.  The cough is intermittent.    She does smoke.  There is a family history of emphysema.  Denies any history of asthma or bronchitis.   ROS per HPI      Past Medical History:  Diagnosis Date  . Fibromyalgia   . Heavy metal poisoning   . Multilevel degenerative disc disease   . Raynaud disease   . Systemic vasculitis (HCC)   . Thyroid disease     Patient Active Problem List   Diagnosis Date Noted  . Fibromyalgia 08/28/2016  . Heavy metal poisoning 08/28/2016  . Hypothyroidism 08/27/2016  . Fatigue 08/27/2016  . Healthcare maintenance 08/27/2016    Past Surgical History:  Procedure Laterality Date  . ABDOMINAL HYSTERECTOMY    . APPENDECTOMY    . CYST EXCISION  2014  . CYST EXCISION  2002   removed from behind ear    OB History   None      Home Medications    Prior to Admission medications   Medication Sig Start Date End Date Taking?  Authorizing Provider  ibuprofen (ADVIL,MOTRIN) 200 MG tablet Take 200 mg by mouth every 6 (six) hours as needed.   Yes [provider]  levothyroxine (SYNTHROID, LEVOTHROID) 100 MCG tablet Take 1 tablet (100 mcg total) by mouth daily. 08/29/16  Yes Danford, Katy D, NP  Vitamin D, Cholecalciferol, 1000 units TABS Take 3 Units by mouth.   Yes [provider]  cetirizine (ZYRTEC) 5 MG tablet Take 1 tablet (5 mg total) by mouth daily. 10/04/17   Dahlia ByesBast, Canesha Tesfaye A, NP  cyclobenzaprine (FLEXERIL) 10 MG tablet Take 0.5-1 tablets (5-10 mg total) by mouth at bedtime as needed for muscle spasms. 05/01/17   Cathie HoopsYu, Amy V, PA-C  meloxicam (MOBIC) 7.5 MG tablet Take 1 tablet (7.5 mg total) by mouth daily. 10/04/17   Jessejames Steelman, Gloris Manchesterraci A, NP  predniSONE (STERAPRED UNI-PAK 21 TAB) 10 MG (21) TBPK tablet Take by mouth daily. Take 6 tabs by mouth day 1, then 5 tabs, then 4 tabs, then 3 tabs, 2 tabs, then 1 tab for the last day 05/01/17   Belinda FisherYu, Amy V, PA-C    Family History Family History  Problem Relation Age of Onset  . Hypertension Mother   . Heart disease Mother   . Lumbar disc disease Mother   . Emphysema Mother   . Liver  cancer Father   . Lung cancer Father   . Heart disease Father   . Coronary artery disease Sister   . COPD Sister   . Hypertension Sister   . Hypertension Brother   . Pulmonary Hypertension Son   . Pulmonary Hypertension Maternal Uncle   . Hypertension Maternal Grandmother   . Bladder Cancer Maternal Grandmother   . Thyroid disease Maternal Grandmother   . Colon cancer Paternal Grandfather   . Cancer Paternal Grandfather   . Emphysema Paternal Grandfather     Social History Social History   Tobacco Use  . Smoking status: Current Every Day Smoker    Types: Cigarettes    Start date: 08/27/1981  . Smokeless tobacco: Never Used  Substance Use Topics  . Alcohol use: Yes    Comment: yearly  . Drug use: No     Allergies   Amoxicillin; Penicillins; Other; Procardia  [nifedipine]; and Oxycodone-acetaminophen   Review of Systems Review of Systems   Physical Exam Triage Vital Signs ED Triage Vitals [10/04/17 1230]  Enc Vitals Group     BP (!) 167/91     Pulse Rate 70     Resp 16     Temp 98.2 F (36.8 C)     Temp Source Oral     SpO2 99 %     Weight      Height      Head Circumference      Peak Flow      Pain Score      Pain Loc      Pain Edu?      Excl. in GC?    No data found.  Updated Vital Signs BP (!) 167/91 (BP Location: Left Arm)   Pulse 70   Temp 98.2 F (36.8 C) (Oral)   Resp 16   SpO2 99%   Visual Acuity Right Eye Distance:   Left Eye Distance:   Bilateral Distance:    Right Eye Near:   Left Eye Near:    Bilateral Near:     Physical Exam  Constitutional: She is oriented to person, place, and time. She appears well-developed and well-nourished.  HENT:  Head: Normocephalic and atraumatic.  Bilateral TMs pearly gray. No oral lesions. No oropharyngeal erythema. No tonsillar swelling or exudates. Drainage noted near uvula.  Eyes: Conjunctivae are normal.  Neck: Normal range of motion.  Cardiovascular: Normal rate and regular rhythm.  Pulmonary/Chest: Effort normal and breath sounds normal.  Lungs clear in all fields.    Musculoskeletal: She exhibits edema and tenderness.  Swelling and tenderness to the right middle finger. More specifically near the DIP joint. Bruising noted. Good flexion and extension of the finger. sensation intact.   No specific tenderness to the right forearm or shoulder. Good ROM with the arm.   Neurological: She is alert and oriented to person, place, and time.  Skin: Skin is warm and dry.  Psychiatric: She has a normal mood and affect.  Nursing note and vitals reviewed.    UC Treatments / Results  Labs (all labs ordered are listed, but only abnormal results are displayed) Labs Reviewed - No data to display  EKG None  Radiology No results found.  Procedures Procedures  (including critical care time)  Medications Ordered in UC Medications - No data to display  Initial Impression / Assessment and Plan / UC Course  I have reviewed the triage vital signs and the nursing notes.  Pertinent labs & imaging results that were available  during my care of the patient were reviewed by me and considered in my medical decision making (see chart for details).     Xray showed closed  non displaced fracture of the right 3rd distal phalanx.  Splint placed on the finger.  Ortho follow up as needed.  Right Arm pain likely nerve irritation and  inflammation.  Sling given for comfort and meloxicam for pain.  Follow up as needed for continued or worsening symptoms   Final Clinical Impressions(s) / UC Diagnoses   Final diagnoses:  Closed nondisplaced fracture of distal phalanx of right middle finger, initial encounter     Discharge Instructions     It was nice meeting you!!  You have a fracture to the third right middle finger.  We will place a splint on the finger. Please wear this until healing. You can take off to take a shower. We will also give you a sling for your shoulder and right arm. Meloxicam for the pain and inflammation.  For your cough I think this is allergy related. Zyrtec should help with this.  Follow up as needed for continued or worsening symptoms     ED Prescriptions    Medication Sig Dispense Auth. Provider   meloxicam (MOBIC) 7.5 MG tablet Take 1 tablet (7.5 mg total) by mouth daily. 30 tablet Melita Villalona A, NP   cetirizine (ZYRTEC) 5 MG tablet Take 1 tablet (5 mg total) by mouth daily. 30 tablet Dahlia Byes A, NP     Controlled Substance Prescriptions [place] Controlled Substance Registry consulted? Not Applicable   Janace Aris, NP 10/07/17 1102

## 2017-10-10 ENCOUNTER — Ambulatory Visit (INDEPENDENT_AMBULATORY_CARE_PROVIDER_SITE_OTHER): Payer: Managed Care, Other (non HMO) | Admitting: Adult Health

## 2017-10-10 ENCOUNTER — Other Ambulatory Visit: Payer: Self-pay

## 2017-10-10 VITALS — BP 152/75 | HR 82 | Ht 67.5 in | Wt 166.6 lb

## 2017-10-10 DIAGNOSIS — M7989 Other specified soft tissue disorders: Secondary | ICD-10-CM | POA: Diagnosis not present

## 2017-10-10 DIAGNOSIS — R200 Anesthesia of skin: Secondary | ICD-10-CM | POA: Diagnosis not present

## 2017-10-10 DIAGNOSIS — R202 Paresthesia of skin: Secondary | ICD-10-CM | POA: Diagnosis not present

## 2017-10-10 DIAGNOSIS — M79641 Pain in right hand: Secondary | ICD-10-CM | POA: Diagnosis not present

## 2017-10-10 MED ORDER — CYCLOBENZAPRINE HCL 10 MG PO TABS: 10.0000 mg | ORAL_TABLET | Freq: Three times a day (TID) | ORAL | 0 refills | Status: DC | PRN

## 2017-10-10 MED ORDER — PREDNISONE 20 MG PO TABS: ORAL_TABLET | ORAL | 0 refills | Status: DC

## 2017-10-10 NOTE — Assessment & Plan Note (Signed)
Continue to take Meloxicam 7.5 mg once daily, avoid other NSAIDs (ie Advil, Motrin, Naproxen) while taking. Please take Prednisone taper as directed and Cyclobenzaprine as needed. Continue to use finger splint and arm sling. Urgent referral placed to Orthopedics.

## 2017-10-10 NOTE — Patient Instructions (Signed)
RICE for Routine Care of Injuries Theroutine careofmanyinjuriesincludes rest, ice, compression, and elevation (RICE therapy). RICE therapy is often recommended for injuries to soft tissues, such as a muscle strain, ligament injuries, bruises, and overuse injuries. It can also be used for some bony injuries. Using RICE therapy can help to relieve pain, lessen swelling, and enable your body to heal. Rest Rest is required to allow your body to heal. This usually involves reducing your normal activities and avoiding use of the injured part of your body. Generally, you can return to your normal activities when you are comfortable and have been given permission by your health care provider. Follow these instructions at home: Ice  Icing your injury helps to keep the swelling down, and it lessens pain. Do not apply ice directly to your skin.  Put ice in a plastic bag.  Place a towel between your skin and the bag.  Leave the ice on for 20 minutes, 2-3 times a day.  Do this for as long as you are directed by your health care provider. Compression Compression means putting pressure on the injured area. Compression helps to keep swelling down, gives support, and helps with discomfort. Compression may be done with an elastic bandage. If an elastic bandage has been applied, follow these general tips:  Remove and reapply the bandage every 3-4 hours or as directed by your health care provider.  Make sure the bandage is not wrapped too tightly, because this can cut off circulation. If part of your body beyond the bandage becomes blue, numb, cold, swollen, or more painful, your bandage is most likely too tight. If this occurs, remove your bandage and reapply it more loosely.  See your health care provider if the bandage seems to be making your problems worse rather than better.  Elevation  Elevation means keeping the injured area raised. This helps to lessen swelling and decrease pain. If possible,  your injured area should be elevated at or above the level of your heart or the center of your chest. When should I seek medical care?  If your pain and swelling continue.  If your symptoms are getting worse rather than improving. These symptoms may indicate that further evaluation or further X-rays are needed. Sometimes, X-rays may not show a small broken bone (fracture) until a number of days later. Make a follow-up appointment with your health care provider. When should I seek immediate medical care?  If you have sudden severe pain at or below the area of your injury.  If you have redness or increased swelling around your injury.  If you have tingling or numbness at or below the area of your injury that does not improve after you remove the elastic bandage. This information is not intended to replace advice given to you by your health care provider. Make sure you discuss any questions you have with your health care provider. Document Released: 05/13/2000 Document Revised: 03/12/2016 Document Reviewed: 01/06/2014 Elsevier Interactive Patient Education  2018 [employer]  Continue to take Meloxicam 7.5 mg once daily, avoid other NSAIDs (ie Advil, Motrin, Naproxen) while taking. Please take Prednisone taper as directed and Cyclobenzaprine as needed. Continue to use finger splint and arm sling. Urgent referral placed to Orthopedics. FEEL BETTER!

## 2017-10-10 NOTE — Progress Notes (Signed)
Subjective:    Patient ID: Morgan Wong, female    DOB: [birth year], 52 y.o.   MRN: [id number]  HPI: Morgan Wong seen in ED 10/04/2017 for RUE injury, notes below: "Patient is a 52 year old female with past medical history of fibromyalgia, degenerative disc disease, right now, vasculitis, thyroid disease.  She presents with right finger pain, right arm pain.  This started on Tuesday after her hand became tangled up in a dog leash and arm was jerked backwards.  Since the hand, specifically middle finger, has become more painful, swollen.  She reports pain that radiates up her right arm into her shoulder.  This pain is intermittent.  She has been taking ibuprofen with minimal relief.  She cannot use ice due to her raynaud's. She denies any numbness, tingling to extremity" Xray-non displaced fracture of the right 3rd distal phalanx.  Splint placed on the finger. Sling provided for RUE. She was given Meloxicam rx and advised to be seen by Ortho PRN  She presents today with continued pain/edema of R hand/fingers. She has new onset of numbness and tingling that began at R shoulder and radiates to R hand- started 48 hrs ago. She also reports increase sensitivity to temperature of R hand/FA She reports pain in R fingers is intermittent, described as "pressure with moments of sharp shooting pain", pain rated 4/10. She is R hand dominant She believes her last cervical MRI was >5 years ago She continues to use tobacco, 5 cigarettes/day  Patient Care Team    Relationship Specialty Notifications Start End  Morgan Wong, Morgan Blossom, NP PCP - General Family Medicine  08/21/16     Patient Active Problem List   Diagnosis Date Noted  . Pain in right hand 10/10/2017  . Swelling of right hand 10/10/2017  . Numbness and tingling in right hand 10/10/2017  . Numbness and tingling of right arm 10/10/2017  . Fibromyalgia 08/28/2016  . Heavy metal poisoning 08/28/2016  . Hypothyroidism 08/27/2016  . Fatigue 08/27/2016   . Healthcare maintenance 08/27/2016     Past Medical History:  Diagnosis Date  . Fibromyalgia   . Heavy metal poisoning   . Multilevel degenerative disc disease   . Raynaud disease   . Systemic vasculitis (HCC)   . Thyroid disease      Past Surgical History:  Procedure Laterality Date  . ABDOMINAL HYSTERECTOMY    . APPENDECTOMY    . CYST EXCISION  2014  . CYST EXCISION  2002   removed from behind ear     Family History  Problem Relation Age of Onset  . Hypertension Mother   . Heart disease Mother   . Lumbar disc disease Mother   . Emphysema Mother   . Liver cancer Father   . Lung cancer Father   . Heart disease Father   . Coronary artery disease Sister   . COPD Sister   . Hypertension Sister   . Hypertension Brother   . Pulmonary Hypertension Son   . Pulmonary Hypertension Maternal Uncle   . Hypertension Maternal Grandmother   . Bladder Cancer Maternal Grandmother   . Thyroid disease Maternal Grandmother   . Colon cancer Paternal Grandfather   . Cancer Paternal Grandfather   . Emphysema Paternal Grandfather      Social History   Substance and Sexual Activity  Drug Use No     Social History   Substance and Sexual Activity  Alcohol Use Yes   Comment: yearly     Social History  Tobacco Use  Smoking Status Current Every Day Smoker  . Types: Cigarettes  . Start date: 08/27/1981  Smokeless Tobacco Never Used     Outpatient Encounter Medications as of 10/10/2017  Medication Sig  . cetirizine (ZYRTEC) 5 MG tablet Take 1 tablet (5 mg total) by mouth daily.  Marland Kitchen. levothyroxine (SYNTHROID, LEVOTHROID) 100 MCG tablet Take 1 tablet (100 mcg total) by mouth daily.  . meloxicam (MOBIC) 7.5 MG tablet Take 1 tablet (7.5 mg total) by mouth daily.  . Vitamin D, Cholecalciferol, 1000 units TABS Take 3 Units by mouth.  . cyclobenzaprine (FLEXERIL) 10 MG tablet Take 1 tablet (10 mg total) by mouth 3 (three) times daily as needed for muscle spasms.  .  predniSONE (DELTASONE) 20 MG tablet 1 tab every 12 hrs for three days, 1 tab daily for 3 days  . [DISCONTINUED] cyclobenzaprine (FLEXERIL) 10 MG tablet Take 0.5-1 tablets (5-10 mg total) by mouth at bedtime as needed for muscle spasms.  . [DISCONTINUED] ibuprofen (ADVIL,MOTRIN) 200 MG tablet Take 200 mg by mouth every 6 (six) hours as needed.  . [DISCONTINUED] predniSONE (STERAPRED UNI-PAK 21 TAB) 10 MG (21) TBPK tablet Take by mouth daily. Take 6 tabs by mouth day 1, then 5 tabs, then 4 tabs, then 3 tabs, 2 tabs, then 1 tab for the last day   No facility-administered encounter medications on file as of 10/10/2017.     Allergies: Amoxicillin; Penicillins; Other; Procardia [nifedipine]; and Oxycodone-acetaminophen  Body mass index is 25.71 kg/m.  Blood pressure (!) 152/75, pulse 82, height 5' 7.5" (1.715 m), weight 166 lb 9.6 oz (75.6 kg), SpO2 97 %.   Review of Systems  Constitutional: Positive for activity change and fatigue. Negative for appetite change, chills, diaphoresis, fever and unexpected weight change.  Eyes: Negative for visual disturbance.  Respiratory: Negative for cough, chest tightness, shortness of breath, wheezing and stridor.   Cardiovascular: Negative for chest pain, palpitations and leg swelling.  Musculoskeletal: Positive for arthralgias, joint swelling, myalgias, neck pain and neck stiffness. Negative for back pain and gait problem.  Neurological: Positive for numbness. Negative for dizziness, tremors and headaches.  Hematological: Does not bruise/bleed easily.       Objective:   Physical Exam  Constitutional: She appears well-developed and well-nourished. No distress.  HENT:  Head: Normocephalic and atraumatic.  Right Ear: External ear normal.  Left Ear: External ear normal.  Nose: Nose normal.  Mouth/Throat: Oropharynx is clear and moist.  Eyes: Pupils are equal, round, and reactive to light. Conjunctivae and EOM are normal.  Cardiovascular: Normal rate,  regular rhythm, normal heart sounds and intact distal pulses.  No murmur heard. Pulmonary/Chest: Effort normal and breath sounds normal. No stridor. No respiratory distress. She has no wheezes. She has no rales. She exhibits no tenderness.  Musculoskeletal: She exhibits edema and tenderness. She exhibits no deformity.       Right shoulder: She exhibits tenderness, spasm and decreased strength. She exhibits normal range of motion, no bony tenderness and no swelling.       Right elbow: She exhibits decreased range of motion and swelling. Tenderness found. Lateral epicondyle tenderness noted.       Right hand: She exhibits decreased range of motion, tenderness, bony tenderness, disruption of two-point discrimination and swelling. She exhibits normal capillary refill, no deformity and no laceration. Decreased sensation noted. Decreased sensation is present in the ulnar distribution and is present in the radial distribution. Decreased strength noted. She exhibits finger abduction and thumb/finger opposition.  Left hand: Normal.  Skin: She is not diaphoretic.      Assessment & Plan:   1. Pain in right hand   2. Swelling of right hand   3. Numbness and tingling in right hand   4. Numbness and tingling of right arm     Numbness and tingling in right hand Continue to take Meloxicam 7.5 mg once daily, avoid other NSAIDs (ie Advil, Motrin, Naproxen) while taking. Please take Prednisone taper as directed and Cyclobenzaprine as needed. Continue to use finger splint and arm sling. Urgent referral placed to Orthopedics.  FOLLOW-UP:  Return if symptoms worsen or fail to improve.

## 2018-11-30 NOTE — Progress Notes (Deleted)
Subjective:    Patient ID: Morgan Wong, female    DOB: [birth year], 53 y.o.   MRN: [id number]  HPI:08/27/2016 OV: Ms. Balla is here to establish as a new pt.  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  She developed severe fatigue, confusion, ultimately dx'd with Heavy Metal Poisoning 2004 and treated with "a special mixture of fluids" by physician in [place] (who per pt is "probably dead by now").  She developed fibromyalgia and was treated with strong, long-term course of narcotics which caused "my tooth to rot out".  She has been on disability since 2012/2013 ? due to heavy metal poisoning, systematic vasculitis, fibromyalgia, and cervical disk compression.  She also reports hx of palpitations and was seen by cards-however is unsure of provider and when/where she was treated.  She has hypothyroidism treated with Levoxyl 140mcg daily, last dose was Oct 2017-due to financial constraints.  Her son lives at home and has primary pulmonary HTN-which his treatment consumes a large portion of household budget.  She lived in [place] for most of her life, then moved to [facility] and few years ago and then recently re-located to [place] area. She is trying to stop smoking, down to just 5 cigarettes/day-GREAT! 11/30/2018 OV:   Ms. Faddis is here for regular f/u:   Patient Care Team    Relationship Specialty Notifications Start End  Esaw Grandchild, NP PCP - General Family Medicine  08/21/16     Patient Active Problem List   Diagnosis Date Noted  . Pain in right hand 10/10/2017  . Swelling of right hand 10/10/2017  . Numbness and tingling in right hand 10/10/2017  . Numbness and tingling of right arm 10/10/2017  . Fibromyalgia 08/28/2016  . Heavy metal poisoning 08/28/2016  . Hypothyroidism 08/27/2016  . Fatigue 08/27/2016  . Healthcare maintenance 08/27/2016     Past Medical History:  Diagnosis Date  . Fibromyalgia   . Heavy metal poisoning   .  Multilevel degenerative disc disease   . Raynaud disease   . Systemic vasculitis ([facility])   . Thyroid disease      Past Surgical History:  Procedure Laterality Date  . ABDOMINAL HYSTERECTOMY    . APPENDECTOMY    . CYST EXCISION  2014  . CYST EXCISION  2002   removed from behind ear     Family History  Problem Relation Age of Onset  . Hypertension Mother   . Heart disease Mother   . Lumbar disc disease Mother   . Emphysema Mother   . Liver cancer Father   . Lung cancer Father   . Heart disease Father   . Coronary artery disease Sister   . COPD Sister   . Hypertension Sister   . Hypertension Brother   . Pulmonary Hypertension Son   . Pulmonary Hypertension Maternal Uncle   . Hypertension Maternal Grandmother   . Bladder Cancer Maternal Grandmother   . Thyroid disease Maternal Grandmother   . Colon cancer Paternal Grandfather   . Cancer Paternal Grandfather   . Emphysema Paternal Grandfather      Social History   Substance and Sexual Activity  Drug Use No     Social History   Substance and Sexual Activity  Alcohol Use Yes   Comment: yearly     Social History   Tobacco Use  Smoking Status Current Every Day Smoker  . Types: Cigarettes  . Start date: 08/27/1981  Smokeless Tobacco Never Used     Outpatient Encounter Medications as of 12/01/2018  Medication Sig  . cetirizine (ZYRTEC) 5 MG tablet Take 1 tablet (5 mg total) by mouth daily.  . cyclobenzaprine (FLEXERIL) 10 MG tablet Take 1 tablet (10 mg total) by mouth 3 (three) times daily as needed for muscle spasms.  Marland Kitchen levothyroxine (SYNTHROID, LEVOTHROID) 100 MCG tablet Take 1 tablet (100 mcg total) by mouth daily.  . meloxicam (MOBIC) 7.5 MG tablet Take 1 tablet (7.5 mg total) by mouth daily.  . predniSONE (DELTASONE) 20 MG tablet 1 tab every 12 hrs for three days, 1 tab daily for 3 days  . Vitamin D, Cholecalciferol, 1000 units TABS Take 3 Units by mouth.   No facility-administered encounter  medications on file as of 12/01/2018.     Allergies: Amoxicillin, Penicillins, Other, Procardia [nifedipine], and Oxycodone-acetaminophen  There is no height or weight on file to calculate BMI.  There were no vitals taken for this visit.     Review of Systems     Objective:   Physical Exam        Assessment & Plan:  No diagnosis found.  No problem-specific Assessment & Plan notes found for this encounter.    FOLLOW-UP:  No follow-ups on file.

## 2018-12-01 ENCOUNTER — Ambulatory Visit: Payer: Managed Care, Other (non HMO) | Admitting: Adult Health

## 2019-02-04 ENCOUNTER — Ambulatory Visit: Payer: Managed Care, Other (non HMO) | Admitting: Adult Health

## 2019-03-03 ENCOUNTER — Other Ambulatory Visit: Payer: Self-pay

## 2019-03-03 ENCOUNTER — Encounter: Payer: Self-pay | Admitting: Adult Health

## 2019-03-03 ENCOUNTER — Ambulatory Visit (INDEPENDENT_AMBULATORY_CARE_PROVIDER_SITE_OTHER): Payer: Managed Care, Other (non HMO) | Admitting: Adult Health

## 2019-03-03 VITALS — BP 152/80 | HR 79 | Temp 98.1°F | Ht 67.5 in | Wt 167.1 lb

## 2019-03-03 DIAGNOSIS — I10 Essential (primary) hypertension: Secondary | ICD-10-CM

## 2019-03-03 DIAGNOSIS — Z0000 Encounter for general adult medical examination without abnormal findings: Secondary | ICD-10-CM | POA: Diagnosis not present

## 2019-03-03 DIAGNOSIS — E559 Vitamin D deficiency, unspecified: Secondary | ICD-10-CM | POA: Diagnosis not present

## 2019-03-03 DIAGNOSIS — E039 Hypothyroidism, unspecified: Secondary | ICD-10-CM | POA: Diagnosis not present

## 2019-03-03 DIAGNOSIS — R739 Hyperglycemia, unspecified: Secondary | ICD-10-CM

## 2019-03-03 MED ORDER — NICOTINE 14 MG/24HR TD PT24: 14.0000 mg | MEDICATED_PATCH | Freq: Every day | TRANSDERMAL | 0 refills | Status: AC

## 2019-03-03 MED ORDER — LISINOPRIL 5 MG PO TABS: 5.0000 mg | ORAL_TABLET | Freq: Every day | ORAL | 0 refills | Status: DC

## 2019-03-03 NOTE — Patient Instructions (Addendum)

## 2019-03-03 NOTE — Assessment & Plan Note (Signed)
She has been off Levothyroxine  QD>18 months. She has been on this dose for years, highest QD TSH drawn today Discussed that she will need TSH in 6 weeks , if she is re-started on Levothyroxine.

## 2019-03-03 NOTE — Progress Notes (Signed)
CMP   Subjective:    Patient ID: Morgan Wong, female    DOB: Dec 01, 1965, 54 y.o.   MRN: [id number]  HPI: 08/27/2016 OV:  Morgan Wong is here to establish as a new pt.  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  She developed severe fatigue, confusion, ultimately dx'd with Heavy Metal Poisoning 2004 and treated with "a special mixture of fluids" by physician in [place] (who per pt is "probably dead by now").  She developed fibromyalgia and was treated with strong, long-term course of narcotics which caused "my tooth to rot out".  She has been on disability since 2012/2013 ? due to heavy metal poisoning, systematic vasculitis, fibromyalgia, and cervical disk compression.  She also reports hx of palpitations and was seen by cards-however is unsure of provider and when/where she was treated.  She has hypothyroidism treated with Levoxyl daily, last dose was Oct 2017-due to financial constraints.  Her son lives at home and has primary pulmonary HTN-which his treatment consumes a large portion of household budget.  She lived in [place] for most of her life, then moved to [facility] and few years ago and then recently re-located to [place] area. She is trying to stop smoking, down to just 5 cigarettes/day-GREAT!  03/03/2019 OV: Morgan Wong is here for f/u- this is here first chronic f/u since establishing with the practice. She reports quite elevated BP at home SBP 160-190s DBP 80-110s She reports occasional palpitations, has hx of anxiety. She reports increased stress due to family issues with some of her children. She has never been on antihypertensive. Today's BP 152/80 Last BP in system 152/75 Will start on low dose ACE- f/u 3 weeks CMP drawn today She denies CP/chest tightness with exertion. She is smoking 1/2 pack per day- agreeable to starting Nicoderm. She denies first degree family hx of CAD/MI/CVA She has been off levothyroxine QD >2  yrs- due to lack of f/u She reports being on for years, has been on as high as QD She reports eating only one meal day- usually lean protein plus vegetables, also states "I snack on a lot of junk". She drinks green tea, sips of water. She denies ETOH use. She remains active with house work and caring for their various animals. She has chronic pain/hx of fibromyalgia- discussed natural treatments for pain- daily walking/stretching/weight control/stopping tobacco.   Patient Care Team    Relationship Specialty Notifications Start End  Julaine Fusi, NP PCP - General Family Medicine  08/21/16     Patient Active Problem List   Diagnosis Date Noted  . HTN, goal below 130/80 03/03/2019  . Pain in right hand 10/10/2017  . Swelling of right hand 10/10/2017  . Numbness and tingling in right hand 10/10/2017  . Numbness and tingling of right arm 10/10/2017  . Fibromyalgia 08/28/2016  . Heavy metal poisoning 08/28/2016  . Hypothyroidism 08/27/2016  . Fatigue 08/27/2016  . Healthcare maintenance 08/27/2016     Past Medical History:  Diagnosis Date  . Fibromyalgia   . Heavy metal poisoning   . Multilevel degenerative disc disease   . Raynaud disease   . Systemic vasculitis (HCC)   . Thyroid disease      Past Surgical History:  Procedure Laterality Date  . ABDOMINAL HYSTERECTOMY    . APPENDECTOMY    . CYST EXCISION  2014  . CYST EXCISION  2002   removed from  behind ear     Family History  Problem Relation Age of Onset  . Hypertension Mother   . Heart disease Mother   . Lumbar disc disease Mother   . Emphysema Mother   . Liver cancer Father   . Lung cancer Father   . Heart disease Father   . Coronary artery disease Sister   . COPD Sister   . Hypertension Sister   . Hypertension Brother   . Pulmonary Hypertension Son   . Pulmonary Hypertension Maternal Uncle   . Hypertension Maternal Grandmother   . Bladder Cancer Maternal Grandmother   . Thyroid  disease Maternal Grandmother   . Colon cancer Paternal Grandfather   . Cancer Paternal Grandfather   . Emphysema Paternal Grandfather      Social History   Substance and Sexual Activity  Drug Use No     Social History   Substance and Sexual Activity  Alcohol Use Yes   Comment: yearly     Social History   Tobacco Use  Smoking Status Current Every Day Smoker  . Types: Cigarettes  . Start date: 08/27/1981  Smokeless Tobacco Never Used     Outpatient Encounter Medications as of 03/03/2019  Medication Sig  . ibuprofen (ADVIL) 200 MG tablet Take 1,000 mg by mouth 2 (two) times daily.  . Vitamin D, Cholecalciferol, 1000 units TABS Take 3 Units by mouth.  Marland Kitchen lisinopril (ZESTRIL) 5 MG tablet Take 1 tablet (5 mg total) by mouth daily.  . nicotine (NICODERM CQ) 14 mg/24hr patch Place 1 patch (14 mg total) onto the skin daily.  . [DISCONTINUED] cetirizine (ZYRTEC) 5 MG tablet Take 1 tablet (5 mg total) by mouth daily.  . [DISCONTINUED] cyclobenzaprine (FLEXERIL) 10 MG tablet Take 1 tablet (10 mg total) by mouth 3 (three) times daily as needed for muscle spasms.  . [DISCONTINUED] levothyroxine (SYNTHROID, LEVOTHROID) 100 MCG tablet Take 1 tablet (100 mcg total) by mouth daily.  . [DISCONTINUED] meloxicam (MOBIC) 7.5 MG tablet Take 1 tablet (7.5 mg total) by mouth daily.  . [DISCONTINUED] predniSONE (DELTASONE) 20 MG tablet 1 tab every 12 hrs for three days, 1 tab daily for 3 days   No facility-administered encounter medications on file as of 03/03/2019.    Allergies: Amoxicillin, Penicillins, Other, Procardia [nifedipine], and Oxycodone-acetaminophen  Body mass index is 25.79 kg/m.  Blood pressure (!) 152/80, pulse 79, temperature 98.1 F (36.7 C), temperature source Oral, height 5' 7.5" (1.715 m), weight 167 lb 1.6 oz (75.8 kg).     Review of Systems  Constitutional: Positive for fatigue. Negative for activity change, appetite change, chills, diaphoresis, fever and  unexpected weight change.  HENT: Negative for congestion.   Eyes: Negative for visual disturbance.  Respiratory: Negative for cough, chest tightness, shortness of breath, wheezing and stridor.   Cardiovascular: Positive for palpitations. Negative for chest pain and leg swelling.  Gastrointestinal: Negative for abdominal distention, abdominal pain, blood in stool, constipation, nausea and vomiting.  Endocrine: Negative for polydipsia, polyphagia and polyuria.  Musculoskeletal: Positive for arthralgias, back pain and myalgias. Negative for gait problem, neck pain and neck stiffness.  Neurological: Negative for dizziness and headaches.  Hematological: Negative for adenopathy. Does not bruise/bleed easily.  Psychiatric/Behavioral: Negative for agitation, behavioral problems, confusion, decreased concentration, dysphoric mood, hallucinations, self-injury, sleep disturbance and suicidal ideas. The patient is nervous/anxious. The patient is not hyperactive.        Objective:   Physical Exam Vitals and nursing note reviewed.  Constitutional:  General: She is not in acute distress.    Appearance: Normal appearance. She is not ill-appearing, toxic-appearing or diaphoretic.  HENT:     Head: Normocephalic and atraumatic.  Eyes:     Extraocular Movements: Extraocular movements intact.     Conjunctiva/sclera: Conjunctivae normal.     Pupils: Pupils are equal, round, and reactive to light.  Neck:     Vascular: No carotid bruit.  Cardiovascular:     Rate and Rhythm: Normal rate and regular rhythm.     Pulses: Normal pulses.     Heart sounds: Normal heart sounds. No murmur. No friction rub. No gallop.   Pulmonary:     Effort: Pulmonary effort is normal. No respiratory distress.     Breath sounds: Normal breath sounds. No stridor. No wheezing, rhonchi or rales.  Chest:     Chest wall: No tenderness.  Skin:    Capillary Refill: Capillary refill takes less than 2 seconds.  Neurological:      Mental Status: She is alert and oriented to person, place, and time.  Psychiatric:        Attention and Perception: Attention and perception normal.        Mood and Affect: Mood is anxious.        Speech: Speech is rapid and pressured.        Behavior: Behavior normal.        Thought Content: Thought content normal.        Cognition and Memory: Cognition and memory normal.        Judgment: Judgment normal.        Assessment & Plan:   1. Vitamin D deficiency   2. Healthcare maintenance   3. Hypothyroidism, unspecified type   4. HTN, goal below 130/80   5. Blood glucose elevated     Healthcare maintenance Please start once daily Lisinopril 5mg - check blood pressure/heart rate daily- record.  Bring log to follow-up in 3 weeks and please come fasting. Please start Nicoderm patch- reduce to stop tobacco use- you can do it! We will contact you with lab results. Increase water intake, follow heart healthy diet. Continue to social distance and wear a mask when in public.  Hypothyroidism She has been off Levothyroxine 119mcg  QD>18 months. She has been on this dose for years, highest 123mcg QD TSH drawn today Discussed that she will need TSH in 6 weeks , if she is re-started on Levothyroxine.  HTN, goal below 130/80 She reports quite elevated BP at home SBP 160-190s DBP 80-110s She reports occasional palpitations, has hx of anxiety. She reports increased stress due to family issues with some of her children. She has never been on antihypertensive. Today's BP 152/80 Last BP in system 152/75 Will start on low dose ACE- f/u 3 weeks CMP drawn today She denies CP/chest    FOLLOW-UP:  Return in about 3 weeks (around 03/24/2019) for Regular Follow Up, HTN, Evaluate Medication Effectiveness, Fasting Labs.

## 2019-03-03 NOTE — Assessment & Plan Note (Signed)
Please start once daily Lisinopril 5mg - check blood pressure/heart rate daily- record.  Bring log to follow-up in 3 weeks and please come fasting. Please start Nicoderm patch- reduce to stop tobacco use- you can do it! We will contact you with lab results. Increase water intake, follow heart healthy diet. Continue to social distance and wear a mask when in public.

## 2019-03-03 NOTE — Assessment & Plan Note (Signed)
She reports quite elevated BP at home SBP 160-190s DBP 80-110s She reports occasional palpitations, has hx of anxiety. She reports increased stress due to family issues with some of her children. She has never been on antihypertensive. Today's BP 152/80 Last BP in system 152/75 Will start on low dose ACE- f/u 3 weeks CMP drawn today She denies CP/chest

## 2019-03-04 LAB — COMPREHENSIVE METABOLIC PANEL
ALT: 15 IU/L (ref 0–32)
AST: 15 IU/L (ref 0–40)
Albumin/Globulin Ratio: 2.2 (ref 1.2–2.2)
Albumin: 4.4 g/dL (ref 3.8–4.9)
Alkaline Phosphatase: 102 IU/L (ref 39–117)
BUN/Creatinine Ratio: 12 (ref 9–23)
BUN: 12 mg/dL (ref 6–24)
Bilirubin Total: <0.2 mg/dL (ref 0.0–1.2)
CO2: 24 mmol/L (ref 20–29)
Calcium: 9.5 mg/dL (ref 8.7–10.2)
Chloride: 102 mmol/L (ref 96–106)
Creatinine, Ser: 0.99 mg/dL (ref 0.57–1.00)
GFR calc Af Amer: 75 mL/min/{1.73_m2} (ref >59)
GFR calc non Af Amer: 65 mL/min/{1.73_m2} (ref >59)
Globulin, Total: 2 g/dL (ref 1.5–4.5)
Glucose: 76 mg/dL (ref 65–99)
Potassium: 3.9 mmol/L (ref 3.5–5.2)
Sodium: 141 mmol/L (ref 134–144)
Total Protein: 6.4 g/dL (ref 6.0–8.5)

## 2019-03-04 LAB — CBC
Hematocrit: 44.6 % (ref 34.0–46.6)
Hemoglobin: 14.4 g/dL (ref 11.1–15.9)
MCH: 29.4 pg (ref 26.6–33.0)
MCHC: 32.3 g/dL (ref 31.5–35.7)
MCV: 91 fL (ref 79–97)
Platelets: 318 10*3/uL (ref 150–450)
RBC: 4.9 x10E6/uL (ref 3.77–5.28)
RDW: 12.7 % (ref 11.7–15.4)
WBC: 10.4 10*3/uL (ref 3.4–10.8)

## 2019-03-04 LAB — TSH: TSH: 18.3 u[IU]/mL — ABNORMAL HIGH (ref 0.450–4.500)

## 2019-03-04 LAB — HEMOGLOBIN A1C
Est. average glucose Bld gHb Est-mCnc: 120 mg/dL
Hgb A1c MFr Bld: 5.8 % — ABNORMAL HIGH (ref 4.8–5.6)

## 2019-03-04 LAB — LDL CHOLESTEROL, DIRECT: LDL Direct: 183 mg/dL — ABNORMAL HIGH (ref 0–99)

## 2019-03-04 LAB — VITAMIN D 25 HYDROXY (VIT D DEFICIENCY, FRACTURES): Vit D, 25-Hydroxy: 20 ng/mL — ABNORMAL LOW (ref 30.0–100.0)

## 2019-03-05 ENCOUNTER — Other Ambulatory Visit: Payer: Self-pay | Admitting: Adult Health

## 2019-03-05 MED ORDER — ATORVASTATIN CALCIUM 20 MG PO TABS: 20.0000 mg | ORAL_TABLET | Freq: Every day | ORAL | 0 refills | Status: DC

## 2019-03-05 MED ORDER — LEVOTHYROXINE SODIUM 50 MCG PO TABS: 50.0000 ug | ORAL_TABLET | Freq: Every day | ORAL | 0 refills | Status: DC

## 2019-03-05 MED ORDER — VITAMIN D (ERGOCALCIFEROL) 1.25 MG (50000 UNIT) PO CAPS: 50000.0000 [IU] | ORAL_CAPSULE | ORAL | 0 refills | Status: AC

## 2019-03-25 ENCOUNTER — Encounter: Payer: Self-pay | Admitting: Adult Health

## 2019-03-25 ENCOUNTER — Ambulatory Visit (INDEPENDENT_AMBULATORY_CARE_PROVIDER_SITE_OTHER): Payer: Managed Care, Other (non HMO) | Admitting: Adult Health

## 2019-03-25 ENCOUNTER — Other Ambulatory Visit: Payer: Self-pay

## 2019-03-25 VITALS — BP 122/71 | HR 76 | Temp 97.9°F | Ht 68.0 in | Wt 162.3 lb

## 2019-03-25 DIAGNOSIS — E7800 Pure hypercholesterolemia, unspecified: Secondary | ICD-10-CM

## 2019-03-25 DIAGNOSIS — Z79899 Other long term (current) drug therapy: Secondary | ICD-10-CM | POA: Diagnosis not present

## 2019-03-25 DIAGNOSIS — Z0000 Encounter for general adult medical examination without abnormal findings: Secondary | ICD-10-CM

## 2019-03-25 DIAGNOSIS — E039 Hypothyroidism, unspecified: Secondary | ICD-10-CM | POA: Diagnosis not present

## 2019-03-25 DIAGNOSIS — I10 Essential (primary) hypertension: Secondary | ICD-10-CM

## 2019-03-25 DIAGNOSIS — E785 Hyperlipidemia, unspecified: Secondary | ICD-10-CM

## 2019-03-25 NOTE — Progress Notes (Signed)
Subjective:    Patient ID: Morgan Wong, female    DOB: [birth year], 54 y.o.   MRN: [id number]  HPI: 08/27/2016 OV:  Morgan Wong is here to establish as a new pt. 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 She developed severe fatigue, confusion, ultimately dx'd with Heavy Metal Poisoning 2004 and treated with "a special mixture of fluids" by physician in [place] (who per pt is "probably dead by now"). She developed fibromyalgia and was treated with strong, long-term course of narcotics which caused "my tooth to rot out". She has been on disability since 2012/2013 ? due to heavy metal poisoning, systematic vasculitis, fibromyalgia, and cervical disk compression. She also reports hx of palpitations and was seen by cards-however is unsure of provider and when/where she was treated. She has hypothyroidism treated with Levoxyl daily, last dose was Oct 2017-due to financial constraints. Her son lives at home and has primary pulmonary HTN-which his treatment consumes a large portion of household budget. She lived in [place] for most of her life, then moved to [facility] and few years ago and then recently re-located to [place] area. She is trying to stop smoking, down to just 5 cigarettes/day-GREAT!  03/03/2019 OV: Morgan Wong is here for f/u- this is here first chronic f/u since establishing with the practice. She reports quite elevated BP at home SBP 160-190s DBP 80-110s She reports occasional palpitations, has hx of anxiety. She reports increased stress due to family issues with some of her children. She has never been on antihypertensive. Today's BP 152/80 Last BP in system 152/75 Will start on low dose ACE- f/u 3 weeks CMP drawn today She denies CP/chest tightness with exertion. She is smoking 1/2 pack per day- agreeable to starting Nicoderm. She denies first degree family hx of CAD/MI/CVA She has been off levothyroxine QD >2 yrs- due  to lack of f/u She reports being on for years, has been on as high as QD She reports eating only one meal day- usually lean protein plus vegetables, also states "I snack on a lot of junk". She drinks green tea, sips of water. She denies ETOH use. She remains active with house work and caring for their various animals. She has chronic pain/hx of fibromyalgia- discussed natural treatments for pain- daily walking/stretching/weight control/stopping tobacco. 03/25/2019 OV: Morgan Wong is here for 3 week f/u: HTN,HLD, Hypothyroidism She was started in Lisinopril 5mg  QD for HTN Ambulatory BP SBP 110-155, mean 130 DBP 70-90, mean 80 HR 70-80 She denies acute cardiac- resp sx's.  She was started on Atorvastatin 20mg  03/05/2019- needs lipids/hepatic fx panel checked in first week of March.  She was started on Levothyroxine 03/07/2019 QD 03/05/2019-needs TSH checked in first week of March. She will start [employer] tobacco cessation program next week-great! She is currently smoking 1/2 pack per day. Patient Care Team    Relationship Specialty Notifications Start End  03/07/2019, NP PCP - General Family Medicine  08/21/16     Patient Active Problem List   Diagnosis Date Noted  . Hyperlipemia 03/25/2019  . HTN, goal below 130/80 03/03/2019  . Pain in right hand 10/10/2017  . Swelling of right hand 10/10/2017  . Numbness and tingling in right hand 10/10/2017  . Numbness and tingling of right arm 10/10/2017  . Fibromyalgia 08/28/2016  . Heavy metal poisoning 08/28/2016  . Hypothyroidism 08/27/2016  . Fatigue 08/27/2016  . Healthcare maintenance  08/27/2016     Past Medical History:  Diagnosis Date  . Fibromyalgia   . Heavy metal poisoning   . Multilevel degenerative disc disease   . Raynaud disease   . Systemic vasculitis (HCC)   . Thyroid disease      Past Surgical History:  Procedure Laterality Date  . ABDOMINAL HYSTERECTOMY    . APPENDECTOMY    . CYST EXCISION  2014  .  CYST EXCISION  2002   removed from behind ear     Family History  Problem Relation Age of Onset  . Hypertension Mother   . Heart disease Mother   . Lumbar disc disease Mother   . Emphysema Mother   . Liver cancer Father   . Lung cancer Father   . Heart disease Father   . Coronary artery disease Sister   . COPD Sister   . Hypertension Sister   . Hypertension Brother   . Pulmonary Hypertension Son   . Pulmonary Hypertension Maternal Uncle   . Hypertension Maternal Grandmother   . Bladder Cancer Maternal Grandmother   . Thyroid disease Maternal Grandmother   . Colon cancer Paternal Grandfather   . Cancer Paternal Grandfather   . Emphysema Paternal Grandfather      Social History   Substance and Sexual Activity  Drug Use No     Social History   Substance and Sexual Activity  Alcohol Use Yes   Comment: yearly     Social History   Tobacco Use  Smoking Status Current Every Day Smoker  . Types: Cigarettes  . Start date: 08/27/1981  Smokeless Tobacco Never Used     Outpatient Encounter Medications as of 03/25/2019  Medication Sig  . atorvastatin (LIPITOR) 20 MG tablet Take 1 tablet (20 mg total) by mouth daily.  Marland Kitchen ibuprofen (ADVIL) 200 MG tablet Take 1,000 mg by mouth 2 (two) times daily.  Marland Kitchen levothyroxine (SYNTHROID) 50 MCG tablet Take 1 tablet (50 mcg total) by mouth daily.  Marland Kitchen lisinopril (ZESTRIL) 5 MG tablet Take 1 tablet (5 mg total) by mouth daily.  . nicotine (NICODERM CQ) 14 mg/24hr patch Place 1 patch (14 mg total) onto the skin daily.  . Vitamin D, Cholecalciferol, 1000 units TABS Take 3 Units by mouth.  . Vitamin D, Ergocalciferol, (DRISDOL) 1.25 MG (50000 UNIT) CAPS capsule Take 1 capsule (50,000 Units total) by mouth every 7 (seven) days.   No facility-administered encounter medications on file as of 03/25/2019.    Allergies: Amoxicillin, Penicillins, Other, Procardia [nifedipine], and Oxycodone-acetaminophen  Body mass index is 24.68  kg/m.  Blood pressure 122/71, pulse 76, temperature 97.9 F (36.6 C), temperature source Oral, height 5\' 8"  (1.727 m), weight 162 lb 4.8 oz (73.6 kg), SpO2 98 %.     Review of Systems  Constitutional: Positive for fatigue. Negative for activity change, appetite change, chills, diaphoresis, fever and unexpected weight change.  HENT: Negative for congestion.   Eyes: Negative for visual disturbance.  Respiratory: Negative for cough, chest tightness, shortness of breath, wheezing and stridor.   Cardiovascular: Negative for chest pain, palpitations and leg swelling.  Gastrointestinal: Negative for abdominal distention, abdominal pain, blood in stool, constipation, diarrhea, nausea and vomiting.  Endocrine: Negative for polydipsia, polyphagia and polyuria.  Genitourinary: Negative for difficulty urinating and flank pain.  Musculoskeletal: Positive for arthralgias, back pain and myalgias. Negative for joint swelling, neck pain and neck stiffness.  Neurological: Negative for dizziness.  Hematological: Negative for adenopathy. Does not bruise/bleed easily.  Psychiatric/Behavioral: Negative for  agitation, behavioral problems, confusion, decreased concentration, dysphoric mood, hallucinations, self-injury, sleep disturbance and suicidal ideas. The patient is not nervous/anxious and is not hyperactive.        Objective:   Physical Exam Vitals and nursing note reviewed.  Constitutional:      General: She is not in acute distress.    Appearance: Normal appearance. She is normal weight. She is not ill-appearing, toxic-appearing or diaphoretic.  HENT:     Head: Normocephalic and atraumatic.  Cardiovascular:     Rate and Rhythm: Normal rate and regular rhythm.     Pulses: Normal pulses.     Heart sounds: Normal heart sounds. No murmur. No friction rub. No gallop.   Pulmonary:     Effort: Pulmonary effort is normal. No respiratory distress.     Breath sounds: Normal breath sounds. No stridor. No  wheezing, rhonchi or rales.  Chest:     Chest wall: No tenderness.  Skin:    General: Skin is warm and dry.     Capillary Refill: Capillary refill takes less than 2 seconds.  Neurological:     Mental Status: She is alert and oriented to person, place, and time.  Psychiatric:        Mood and Affect: Mood normal.        Behavior: Behavior normal.        Thought Content: Thought content normal.        Judgment: Judgment normal.        Assessment & Plan:   1. Hypothyroidism, unspecified type   2. Elevated LDL cholesterol level   3. On statin therapy   4. Healthcare maintenance   5. HTN, goal below 130/80   6. Hyperlipidemia, unspecified hyperlipidemia type     Healthcare maintenance BP at goal- continue all medications as directed. Remain well hydrated, follow heart healthy diet. Start Tobacco Cessation program- you can do it! Please schedule fasting lab appt the first week of March. Please schedule physical in Spring 2021. Continue to social distance and wear a mask.   HTN, goal below 130/80 Continue Lisinopril QD Ambulatory BP SBP 110-155, mean 130 DBP 70-90, mean 80 HR 70-80 BP today 122/7, HR 76 She denies acute cardiac- resp sx's.   Hyperlipemia She was started on Atorvastatin 20mg  03/05/2019- needs lipids/hepatic fx panel checked in first week of March.    FOLLOW-UP:  Return in about 8 weeks (around 05/20/2019) for CPE.

## 2019-03-25 NOTE — Patient Instructions (Addendum)
Managing Your Hypertension Hypertension is commonly called high blood pressure. This is when the force of your blood pressing against the walls of your arteries is too strong. Arteries are blood vessels that carry blood from your heart throughout your body. Hypertension forces the heart to work harder to pump blood, and may cause the arteries to become narrow or stiff. Having untreated or uncontrolled hypertension can cause heart attack, stroke, kidney disease, and other problems. What are blood pressure readings? A blood pressure reading consists of a higher number over a lower number. Ideally, your blood pressure should be below 120/80. The first ("top") number is called the systolic pressure. It is a measure of the pressure in your arteries as your heart beats. The second ("bottom") number is called the diastolic pressure. It is a measure of the pressure in your arteries as the heart relaxes. What does my blood pressure reading mean? Blood pressure is classified into four stages. Based on your blood pressure reading, your health care provider may use the following stages to determine what type of treatment you need, if any. Systolic pressure and diastolic pressure are measured in a unit called mm Hg. Normal  Systolic pressure: below 120.  Diastolic pressure: below 80. Elevated  Systolic pressure: 120-129.  Diastolic pressure: below 80. Hypertension stage 1  Systolic pressure: 130-139.  Diastolic pressure: 80-89. Hypertension stage 2  Systolic pressure: 140 or above.  Diastolic pressure: 90 or above. What health risks are associated with hypertension? Managing your hypertension is an important responsibility. Uncontrolled hypertension can lead to:  A heart attack.  A stroke.  A weakened blood vessel (aneurysm).  Heart failure.  Kidney damage.  Eye damage.  Metabolic syndrome.  Memory and concentration problems. What changes can I make to manage my  hypertension? Hypertension can be managed by making lifestyle changes and possibly by taking medicines. Your health care provider will help you make a plan to bring your blood pressure within a normal range. Eating and drinking   Eat a diet that is high in fiber and potassium, and low in salt (sodium), added sugar, and fat. An example eating plan is called the DASH (Dietary Approaches to Stop Hypertension) diet. To eat this way: ? Eat plenty of fresh fruits and vegetables. Try to fill half of your plate at each meal with fruits and vegetables. ? Eat whole grains, such as whole wheat pasta, brown rice, or whole grain bread. Fill about one quarter of your plate with whole grains. ? Eat low-fat diary products. ? Avoid fatty cuts of meat, processed or cured meats, and poultry with skin. Fill about one quarter of your plate with lean proteins such as fish, chicken without skin, beans, eggs, and tofu. ? Avoid premade and processed foods. These tend to be higher in sodium, added sugar, and fat.  Reduce your daily sodium intake. Most people with hypertension should eat less than 1,500 mg of sodium a day.  Limit alcohol intake to no more than 1 drink a day for nonpregnant women and 2 drinks a day for men. One drink equals 12 oz of beer, 5 oz of wine, or 1 oz of hard liquor. Lifestyle  Work with your health care provider to maintain a healthy body weight, or to lose weight. Ask what an ideal weight is for you.  Get at least 30 minutes of exercise that causes your heart to beat faster (aerobic exercise) most days of the week. Activities may include walking, swimming, or biking.  Include exercise   to strengthen your muscles (resistance exercise), such as weight lifting, as part of your weekly exercise routine. Try to do these types of exercises for 30 minutes at least 3 days a week.  Do not use any products that contain nicotine or tobacco, such as cigarettes and e-cigarettes. If you need help quitting,  ask your health care provider.  Control any long-term (chronic) conditions you have, such as high cholesterol or diabetes. Monitoring  Monitor your blood pressure at home as told by your health care provider. Your personal target blood pressure may vary depending on your medical conditions, your age, and other factors.  Have your blood pressure checked regularly, as often as told by your health care provider. Working with your health care provider  Review all the medicines you take with your health care provider because there may be side effects or interactions.  Talk with your health care provider about your diet, exercise habits, and other lifestyle factors that may be contributing to hypertension.  Visit your health care provider regularly. Your health care provider can help you create and adjust your plan for managing hypertension. Will I need medicine to control my blood pressure? Your health care provider may prescribe medicine if lifestyle changes are not enough to get your blood pressure under control, and if:  Your systolic blood pressure is 130 or higher.  Your diastolic blood pressure is 80 or higher. Take medicines only as told by your health care provider. Follow the directions carefully. Blood pressure medicines must be taken as prescribed. The medicine does not work as well when you skip doses. Skipping doses also puts you at risk for problems. Contact a health care provider if:  You think you are having a reaction to medicines you have taken.  You have repeated (recurrent) headaches.  You feel dizzy.  You have swelling in your ankles.  You have trouble with your vision. Get help right away if:  You develop a severe headache or confusion.  You have unusual weakness or numbness, or you feel faint.  You have severe pain in your chest or abdomen.  You vomit repeatedly.  You have trouble breathing. Summary  Hypertension is when the force of blood pumping  through your arteries is too strong. If this condition is not controlled, it may put you at risk for serious complications.  Your personal target blood pressure may vary depending on your medical conditions, your age, and other factors. For most people, a normal blood pressure is less than 120/80.  Hypertension is managed by lifestyle changes, medicines, or both. Lifestyle changes include weight loss, eating a healthy, low-sodium diet, exercising more, and limiting alcohol. This information is not intended to replace advice given to you by your health care provider. Make sure you discuss any questions you have with your health care provider. Document Revised: 05/23/2018 Document Reviewed: 12/28/2015 Elsevier Patient Education  2020 [employer] Inc.   BP at goal- continue all medications as directed. Remain well hydrated, follow heart healthy diet. Start Tobacco Cessation program- you can do it! Please schedule fasting lab appt the first week of March. Please schedule physical in Spring 2021. Continue to social distance and wear a mask.  GREAT TO SEE YOU!

## 2019-03-25 NOTE — Assessment & Plan Note (Signed)
She was started on Atorvastatin 20mg  03/05/2019- needs lipids/hepatic fx panel checked in first week of March.

## 2019-03-25 NOTE — Assessment & Plan Note (Signed)
Continue Lisinopril QD Ambulatory BP SBP 110-155, mean 130 DBP 70-90, mean 80 HR 70-80 BP today 122/7, HR 76 She denies acute cardiac- resp sx's.

## 2019-03-25 NOTE — Assessment & Plan Note (Signed)
BP at goal- continue all medications as directed. Remain well hydrated, follow heart healthy diet. Start Tobacco Cessation program- you can do it! Please schedule fasting lab appt the first week of March. Please schedule physical in Spring 2021. Continue to social distance and wear a mask.

## 2019-03-31 ENCOUNTER — Telehealth: Payer: Self-pay | Admitting: Adult Health

## 2019-03-31 MED ORDER — LISINOPRIL 5 MG PO TABS: 5.0000 mg | ORAL_TABLET | Freq: Every day | ORAL | 0 refills | Status: DC

## 2019-03-31 NOTE — Telephone Encounter (Signed)
Patient is requesting a refill of her lisinopril, if approved please send to [facility] in [place].  Also, she has been dealing with allergy issues with Orpha Bur and Orpha Bur wanted her to try OTC meds first and to call back if no help. She states she has tried Zyrtec, Claritin, and Allegra with no relief. She is wondering what Orpha Bur was planning on after trying the OTC meds. Please advise

## 2019-03-31 NOTE — Addendum Note (Signed)
Addended by: Sylvester Harder on: 03/31/2019 04:33 PM   Modules accepted: Orders

## 2019-03-31 NOTE — Telephone Encounter (Signed)
Lisinopril sent to [facility] in [place] per patient request.  I spoke with Dr Sharee Holster in regards to allergy meds. Per Dr. Sharee Holster patient should try using OTC flonase spray in each nostril once in am and once in pm along with a nedi pot with saline water. Also can try xyzal otc and that she needs to do this regiment atleast 3-4 weeks daily before she will see any relief.   Patient is aware of the above and verbalized understanding.   Per Jettie Pagan last note patient needs CPE scheduled for Spring 2021. Call transferred to front desk for scheduling. AS, CMA

## 2019-04-14 ENCOUNTER — Other Ambulatory Visit: Payer: Managed Care, Other (non HMO)

## 2019-04-14 ENCOUNTER — Other Ambulatory Visit: Payer: Self-pay

## 2019-04-14 DIAGNOSIS — Z79899 Other long term (current) drug therapy: Secondary | ICD-10-CM

## 2019-04-14 DIAGNOSIS — E039 Hypothyroidism, unspecified: Secondary | ICD-10-CM

## 2019-04-14 DIAGNOSIS — E785 Hyperlipidemia, unspecified: Secondary | ICD-10-CM

## 2019-04-15 ENCOUNTER — Other Ambulatory Visit: Payer: Managed Care, Other (non HMO)

## 2019-04-16 ENCOUNTER — Other Ambulatory Visit: Payer: Self-pay

## 2019-04-16 DIAGNOSIS — E559 Vitamin D deficiency, unspecified: Secondary | ICD-10-CM

## 2019-05-14 ENCOUNTER — Other Ambulatory Visit: Payer: 59

## 2019-05-14 ENCOUNTER — Other Ambulatory Visit: Payer: Self-pay

## 2019-05-14 DIAGNOSIS — Z79899 Other long term (current) drug therapy: Secondary | ICD-10-CM

## 2019-05-14 DIAGNOSIS — E039 Hypothyroidism, unspecified: Secondary | ICD-10-CM

## 2019-05-14 DIAGNOSIS — E785 Hyperlipidemia, unspecified: Secondary | ICD-10-CM

## 2019-05-14 DIAGNOSIS — E559 Vitamin D deficiency, unspecified: Secondary | ICD-10-CM

## 2019-05-15 LAB — LIPID PANEL
Chol/HDL Ratio: 7.4 ratio — ABNORMAL HIGH (ref 0.0–4.4)
Cholesterol, Total: 272 mg/dL — ABNORMAL HIGH (ref 100–199)
HDL: 37 mg/dL — ABNORMAL LOW (ref >39)
LDL Chol Calc (NIH): 177 mg/dL — ABNORMAL HIGH (ref 0–99)
Triglycerides: 301 mg/dL — ABNORMAL HIGH (ref 0–149)
VLDL Cholesterol Cal: 58 mg/dL — ABNORMAL HIGH (ref 5–40)

## 2019-05-15 LAB — HEPATIC FUNCTION PANEL
ALT: 11 IU/L (ref 0–32)
AST: 15 IU/L (ref 0–40)
Albumin: 4.4 g/dL (ref 3.8–4.9)
Alkaline Phosphatase: 98 IU/L (ref 39–117)
Bilirubin Total: <0.2 mg/dL (ref 0.0–1.2)
Bilirubin, Direct: 0.05 mg/dL (ref 0.00–0.40)
Total Protein: 6.4 g/dL (ref 6.0–8.5)

## 2019-05-15 LAB — TSH: TSH: 18.4 u[IU]/mL — ABNORMAL HIGH (ref 0.450–4.500)

## 2019-05-15 LAB — VITAMIN D 25 HYDROXY (VIT D DEFICIENCY, FRACTURES): Vit D, 25-Hydroxy: 35.4 ng/mL (ref 30.0–100.0)

## 2019-05-19 ENCOUNTER — Other Ambulatory Visit: Payer: Self-pay | Admitting: Family Medicine

## 2019-05-19 ENCOUNTER — Telehealth: Payer: Self-pay | Admitting: Adult Health

## 2019-05-19 ENCOUNTER — Telehealth: Payer: Self-pay | Admitting: Family Medicine

## 2019-05-19 MED ORDER — LEVOTHYROXINE SODIUM 50 MCG PO TABS: 50.0000 ug | ORAL_TABLET | Freq: Every day | ORAL | 0 refills | Status: DC

## 2019-05-19 NOTE — Telephone Encounter (Signed)
Please see result note. AS, CMA

## 2019-05-19 NOTE — Telephone Encounter (Signed)
Error

## 2019-05-19 NOTE — Telephone Encounter (Signed)
Patient says Archie Patten left her a message to call office---Pt states isn't feeling well & ask med asst to call her back tomorrow.  ---Ames Coupe message.  -glh

## 2019-05-28 ENCOUNTER — Encounter: Payer: Managed Care, Other (non HMO) | Admitting: Family Medicine

## 2019-06-03 ENCOUNTER — Encounter: Payer: Self-pay | Admitting: Physician Assistant

## 2019-06-03 ENCOUNTER — Other Ambulatory Visit: Payer: Self-pay

## 2019-06-03 ENCOUNTER — Ambulatory Visit (INDEPENDENT_AMBULATORY_CARE_PROVIDER_SITE_OTHER): Payer: 59 | Admitting: Physician Assistant

## 2019-06-03 VITALS — BP 151/88 | HR 59 | Temp 97.8°F | Ht 68.0 in | Wt 170.1 lb

## 2019-06-03 DIAGNOSIS — E785 Hyperlipidemia, unspecified: Secondary | ICD-10-CM | POA: Diagnosis not present

## 2019-06-03 DIAGNOSIS — T5691XA Toxic effect of unspecified metal, accidental (unintentional), initial encounter: Secondary | ICD-10-CM

## 2019-06-03 DIAGNOSIS — J3089 Other allergic rhinitis: Secondary | ICD-10-CM

## 2019-06-03 DIAGNOSIS — E039 Hypothyroidism, unspecified: Secondary | ICD-10-CM

## 2019-06-03 DIAGNOSIS — R6889 Other general symptoms and signs: Secondary | ICD-10-CM

## 2019-06-03 DIAGNOSIS — I10 Essential (primary) hypertension: Secondary | ICD-10-CM | POA: Diagnosis not present

## 2019-06-03 DIAGNOSIS — E079 Disorder of thyroid, unspecified: Secondary | ICD-10-CM

## 2019-06-03 DIAGNOSIS — Z808 Family history of malignant neoplasm of other organs or systems: Secondary | ICD-10-CM

## 2019-06-03 DIAGNOSIS — Z716 Tobacco abuse counseling: Secondary | ICD-10-CM

## 2019-06-03 MED ORDER — OLOPATADINE HCL 0.1 % OP SOLN: 1.0000 [drp] | Freq: Two times a day (BID) | OPHTHALMIC | 1 refills | Status: AC

## 2019-06-03 MED ORDER — ROSUVASTATIN CALCIUM 20 MG PO TABS: 20.0000 mg | ORAL_TABLET | Freq: Every day | ORAL | 1 refills | Status: AC

## 2019-06-03 MED ORDER — AZELASTINE HCL 0.1 % NA SOLN: 1.0000 | Freq: Two times a day (BID) | NASAL | 1 refills | Status: AC

## 2019-06-03 NOTE — Progress Notes (Signed)
Established Patient Office Visit  Subjective:  Patient ID: Morgan Wong, female    DOB: 1965/08/29  Age: 54 y.o. MRN: [id number]  CC:  Chief Complaint  Patient presents with  . Results    HPI Morgan Wong presents for discussion of lab results.   Hypothyroid: - Patient reports she has had hypothyroid for many years and usually has to be at 100 MCG to help maintain her thyroid levels stable. Her TSH 3 mons ago was 18.300 and was re-started on levothyroxine because she had been off her med for some time. She was re-started at 50 mcg dose. Her TSH 2 weeks ago was 18.400 and patient denies missing any doses of levothyroxine. States she feels her thyroid is off because she feels very tired and has noticed weight gain. She also reports noticing like a nodule on the right side of her neck near her thyroid, which she feels has recently gotten bigger. She's had this x 5 years. Reports tenderness and sometimes she does have difficulty swallowing w/ certain things like pills. She does have family history of thyroid cancer (first degree cousin).  - States she developed metal poisoning ( in 2004) from her occupation as a Building control surveyor and since then her body hasn't been the same. States she has an autoimmune condition that doctor's don't really know what it is.   Hyperlipidemia: - Reports medication compliance - States that when her thyroid is uncontrolled her cholesterol levels are usually high too  HTN: - Pt reports med compliance and denies side effects. - Checks BP at home 3x's/day and readings usually avg. 120s/70s but lately it has been running higher like in 160s/80-90s. States she noticed increase when she started taking Nyquil, Dayquil and Flonase to help with her with allergies, cough and congestion. - Denies chest pain, palpitations, dizziness or swelling.   Environmental and seasonal allergies  - States her allergies have been severe this season. - She has tried all the OTC allergy meds and  is currently taking Xyzal and flonase. She is also doing nasal rinses.  - Flonase used to be effective in the past but no longer provides much relief.  - Reports sneezing, itchy eyes, post nasal drainage, sore throat, dry cough, nasal congestion, and fatigue.  Tobacco use: - States she has decreased her smoking to one cigarette/day. - She is participating in a smoking cessation program through [employer] (spouse's employer) which is free. - Reports is using nicotine patches.   Past Medical History:  Diagnosis Date  . Fibromyalgia   . Heavy metal poisoning   . Multilevel degenerative disc disease   . Raynaud disease   . 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 Thyroid disease     Past Surgical History:  Procedure Laterality Date  . ABDOMINAL HYSTERECTOMY    . APPENDECTOMY    . CYST EXCISION  2014  . CYST EXCISION  2002   removed from behind ear    Family History  Problem Relation Age of Onset  . Hypertension Mother   . Heart disease Mother   . Lumbar disc disease Mother   . Emphysema Mother   . Liver cancer Father   . Lung cancer Father   . Heart disease Father   . Coronary artery disease Sister   . COPD Sister   . Hypertension Sister   . Hypertension Brother   . Pulmonary Hypertension Son   . Pulmonary Hypertension Maternal Uncle   . Hypertension Maternal Grandmother   . Bladder Cancer  Maternal Grandmother   . Thyroid disease Maternal Grandmother   . Colon cancer Paternal Grandfather   . Cancer Paternal Grandfather   . Emphysema Paternal Grandfather     Social History   Socioeconomic History  . Marital status: Married    Spouse name: Not on file  . Number of children: Not on file  . Years of education: Not on file  . Highest education level: Not on file  Occupational History  . Not on file  Tobacco Use  . Smoking status: Current Every Day Smoker    Types: Cigarettes    Start date: 08/27/1981  . Smokeless tobacco: Never Used  Substance and Sexual Activity  . Alcohol  use: Yes    Comment: yearly  . Drug use: No  . Sexual activity: Yes    Partners: Male    Birth control/protection: None  Other Topics Concern  . Not on file  Social History Narrative  . Not on file   Social Determinants of Health   Financial Resource Strain:   . Difficulty of Paying Living Expenses:   Food Insecurity:   . Worried About Programme researcher, broadcasting/film/video in the Last Year:   . Barista in the Last Year:   Transportation Needs:   . Freight forwarder (Medical):   Marland Kitchen Lack of Transportation (Non-Medical):   Physical Activity:   . Days of Exercise per Week:   . Minutes of Exercise per Session:   Stress:   . Feeling of Stress :   Social Connections:   . Frequency of Communication with Friends and Family:   . Frequency of Social Gatherings with Friends and Family:   . Attends Religious Services:   . Active Member of Clubs or Organizations:   . Attends Banker Meetings:   Marland Kitchen Marital Status:   Intimate Partner Violence:   . Fear of Current or Ex-Partner:   . Emotionally Abused:   Marland Kitchen Physically Abused:   . Sexually Abused:     Outpatient Medications Prior to Visit  Medication Sig Dispense Refill  . ibuprofen (ADVIL) 200 MG tablet Take 1,000 mg by mouth 2 (two) times daily.    Marland Kitchen levothyroxine (SYNTHROID) 50 MCG tablet Take 1 tablet (50 mcg total) by mouth daily. 30 tablet 0  . lisinopril (ZESTRIL) 5 MG tablet Take 1 tablet (5 mg total) by mouth daily. 90 tablet 0  . nicotine (NICODERM CQ) 14 mg/24hr patch Place 1 patch (14 mg total) onto the skin daily. 42 patch 0  . Vitamin D, Cholecalciferol, 1000 units TABS Take 3 Units by mouth.    . Vitamin D, Ergocalciferol, (DRISDOL) 1.25 MG (50000 UNIT) CAPS capsule Take 1 capsule (50,000 Units total) by mouth every 7 (seven) days. 16 capsule 0  . atorvastatin (LIPITOR) 20 MG tablet Take 1 tablet (20 mg total) by mouth daily. 90 tablet 0   No facility-administered medications prior to visit.    Allergies   Allergen Reactions  . Amoxicillin Anaphylaxis    Cannot take any "Cillin" drugs  . Penicillins Anaphylaxis  . Other     Vicryl/Monocryl Infection like feeling/burning/fever  . Procardia [Nifedipine] Swelling  . Oxycodone-Acetaminophen     ROS Review of Systems  Review of Systems:  A fourteen system review of systems was performed and found to be positive as per HPI.  Objective:    Physical Exam General: Appropriate for stated age, in no acute distress. Neuro:  Alert and oriented, extra-ocular muscles intact, conjunctival hyperemia  B/L  HEENT:  Normocephalic, atraumatic, possible thyromegaly of right side appreciated Skin:  no gross rash, warm, pink. Cardiac:  RRR, S1 S2 Respiratory:  ECTA B/L and A/P, Not using accessory muscles, speaking in full sentences- unlabored. Vascular:  Ext warm, no cyanosis apprec. Psych:  No HI/SI, judgement and insight good, Euthymic mood. Full Affect.   BP (!) 151/88   Pulse (!) 59   Temp 97.8 F (36.6 C) (Oral)   Ht 5\' 8"  (1.727 m)   Wt 170 lb 1.6 oz (77.2 kg)   SpO2 98% Comment: on RA  BMI 25.86 kg/m  Wt Readings from Last 3 Encounters:  06/03/19 170 lb 1.6 oz (77.2 kg)  03/25/19 162 lb 4.8 oz (73.6 kg)  03/03/19 167 lb 1.6 oz (75.8 kg)     Health Maintenance Due  Topic Date Due  . HIV Screening  Never done  . COVID-19 Vaccine (1) Never done  . TETANUS/TDAP  Never done  . PAP SMEAR-Modifier  Never done  . MAMMOGRAM  Never done    There are no preventive care reminders to display for this patient.  Lab Results  Component Value Date   TSH 18.400 (H) 05/14/2019   Lab Results  Component Value Date   WBC 10.4 03/03/2019   HGB 14.4 03/03/2019   HCT 44.6 03/03/2019   MCV 91 03/03/2019   PLT 318 03/03/2019   Lab Results  Component Value Date   NA 141 03/03/2019   K 3.9 03/03/2019   CO2 24 03/03/2019   GLUCOSE 76 03/03/2019   BUN 12 03/03/2019   CREATININE 0.99 03/03/2019   BILITOT <0.2 05/14/2019   ALKPHOS 98  05/14/2019   AST 15 05/14/2019   ALT 11 05/14/2019   PROT 6.4 05/14/2019   ALBUMIN 4.4 05/14/2019   CALCIUM 9.5 03/03/2019   Lab Results  Component Value Date   CHOL 272 (H) 05/14/2019   Lab Results  Component Value Date   HDL 37 (L) 05/14/2019   Lab Results  Component Value Date   LDLCALC 177 (H) 05/14/2019   Lab Results  Component Value Date   TRIG 301 (H) 05/14/2019   Lab Results  Component Value Date   CHOLHDL 7.4 (H) 05/14/2019   Lab Results  Component Value Date   HGBA1C 5.8 (H) 03/03/2019      Assessment & Plan:   Problem List Items Addressed This Visit      Cardiovascular and Mediastinum   HTN, goal below 130/80   Relevant Medications   rosuvastatin (CRESTOR) 20 MG tablet     Endocrine   Hypothyroidism - Primary   Relevant Orders   T4, free   T3   03/05/2019 THYROID   Ambulatory referral to Endocrinology     Other   Heavy metal poisoning   Hyperlipemia   Relevant Medications   rosuvastatin (CRESTOR) 20 MG tablet    Other Visit Diagnoses     Possible palpable abnormality of thyroid gland       Relevant Orders   Ambulatory referral to Endocrinology   Family history of thyroid cancer       Relevant Orders   Ambulatory referral to Endocrinology   Environmental and seasonal allergies       Relevant Medications   olopatadine (PATANOL) 0.1 % ophthalmic solution   azelastine (ASTELIN) 0.1 % nasal spray   Itchy eyes       Encounter for smoking cessation counseling          Meds ordered this  encounter  Medications  . olopatadine (PATANOL) 0.1 % ophthalmic solution    Sig: Place 1 drop into both eyes 2 (two) times daily.    Dispense:  5 mL    Refill:  1  . rosuvastatin (CRESTOR) 20 MG tablet    Sig: Take 1 tablet (20 mg total) by mouth daily.    Dispense:  90 tablet    Refill:  1  . azelastine (ASTELIN) 0.1 % nasal spray    Sig: Place 1-2 sprays into both nostrils 2 (two) times daily. Use in each nostril as directed    Dispense:  30 mL     Refill:  1   Hypothyroid: - TSH is very elevated and would like to further evaluate thyroid hormones so placed future order for free T4 and T3. Pt agreeable to return later this week to get these labs done. Couldn't draw them today because it's near the end of the day and [facility] courier will be picking up labs soon. - Given patient's family history (1st degree cousin) of thyroid cancer, uncontrolled hypothyroidism, possible thyromegaly on PE, and patient's concern about nodule getting bigger will order thyroid ultrasound to further evaluate.  - Will place order to endocrinology for further evaluation and management of hypothyroid. Pt is agreeable. - Pending additional labs will make dose adjustment to levothyroxine until she seen by endocrinology, so they can continue management.   Hypertension: - Elevated BP most likely multi-factorial including the OTC medications and advised to be careful with OTC meds like decongestants because they can increase BP. - BP at last OV (04/04/19)  wnl at 122/71 - Continue lisinopril 5 mg once daily. - Continue ambulatory blood pressure checks and bring log at next office visit  - If BP continues to be above goal at next OV will consider increasing lisinopril dose - Encouraged to continue low salt diet and staying as active as possible  - Will continue to monitor  Hyperlipidemia: The 10-year ASCVD risk score Denman George DC Montez Hageman., et al., 2013) is: 16.8%   Values used to calculate the score:     Age: 80 years     Sex: Female     Is Non-Hispanic African American: No     Diabetic: No     Tobacco smoker: Yes     Systolic Blood Pressure: 151 mmHg     Is BP treated: Yes     HDL Cholesterol: 37 mg/dL     Total Cholesterol: 272 mg/dL - Pt reports compliance with atorvastatin 20 mg and feels like her cholesterol is elevated because her thyroid is not well controlled. - Hypothyroid can be a secondary cause of hyperlipidemia and thyroid needs to be under better control. -  Discussed the importance of reducing cholesterol to decrease risk of coronary artery disease.  - ASCVD score is 16% and pt is agreeable to changing to a higher intensity statin. Discontinued atorvastatin 20 mg and started rosuvastatin 20 mg.  - Recheck lipid panel and hepatic function in 6 weeks. - Follow heart healthy diet and stay as active as possible.  Environmental and seasonal allergies: - Continue Xyzal. - Use Patanol eye drops as needed for itchy eyes. - Use Astelin nasal spray. - Continue nasal rinses.  Tobacco use/Smoking Cessation: - Continue smoking cessation program. - Continue Nicoderm. - Reducing smoking will also help reduce cardiovascular risk.   Follow-up: Return for CPE as scheduled.    Mayer Masker, PA-C

## 2019-06-03 NOTE — Patient Instructions (Signed)
Allergies, Adult An allergy is when your body's defense system (immune system) overreacts to an otherwise harmless substance (allergen) that you breathe in or eat or something that touches your skin. When you come into contact with something that you are allergic to, your immune system produces certain proteins (antibodies). These proteins cause cells to release chemicals (histamines) that trigger the symptoms of an allergic reaction. Allergies often affect the nasal passages (allergic rhinitis), eyes (allergic conjunctivitis), skin (atopic dermatitis), and stomach. Allergies can be mild or severe. Allergies cannot spread from person to person (are not contagious). They can develop at any age and may be outgrown. What increases the risk? You may be at greater risk of allergies if other people in your family have allergies. What are the signs or symptoms? Symptoms depend on what type of allergy you have. They may include:  Runny, stuffy nose.  Sneezing.  Itchy mouth, ears, or throat.  Postnasal drip.  Sore throat.  Itchy, red, watery, or puffy eyes.  Skin rash or hives.  Stomach pain.  Vomiting.  Diarrhea.  Bloating.  Wheezing or coughing. People with a severe allergy to food, medicine, or an insect bite may have a life-threatening allergic reaction (anaphylaxis). Symptoms of anaphylaxis include:  Hives.  Itching.  Flushed face.  Swollen lips, tongue, or mouth.  Tight or swollen throat.  Chest pain or tightness in the chest.  Trouble breathing or shortness of breath.  Rapid heartbeat.  Dizziness or fainting.  Vomiting.  Diarrhea.  Pain in the abdomen. How is this diagnosed? This condition is diagnosed based on:  Your symptoms.  Your family and medical history.  A physical exam. You may need to see a health care provider who specializes in treating allergies (allergist). You may also have tests, including:  Skin tests to see which allergens are causing  your symptoms, such as: ? Skin prick test. In this test, your skin is pricked with a tiny needle and exposed to small amounts of possible allergens to see if your skin reacts. ? Intradermal skin test. In this test, a small amount of allergen is injected under your skin to see if your skin reacts. ? Patch test. In this test, a small amount of allergen is placed on your skin and then your skin is covered with a bandage. Your health care provider will check your skin after a couple of days to see if a rash has developed.  Blood tests.  Challenges tests. In this test, you inhale a small amount of allergen by mouth to see if you have an allergic reaction. You may also be asked to:  Keep a food diary. A food diary is a record of all the foods and drinks you have in a day and any symptoms you experience.  Practice an elimination diet. An elimination diet involves eliminating specific foods from your diet and then adding them back in one by one to find out if a certain food causes an allergic reaction. How is this treated? Treatment for allergies depends on your symptoms. Treatment may include:  Cold compresses to soothe itching and swelling.  Eye drops.  Nasal sprays.  Using a saline spray or container (neti pot) to flush out the nose (nasal irrigation). These methods can help clear away mucus and keep the nasal passages moist.  Using a humidifier.  Oral antihistamines or other medicines to block allergic reaction and inflammation.  Skin creams to treat rashes or itching.  Diet changes to eliminate food allergy triggers.    Repeated exposure to tiny amounts of allergens to build up a tolerance and prevent future allergic reactions (immunotherapy). These include: ? Allergy shots. ? Oral treatment. This involves taking small doses of an allergen under the tongue (sublingual immunotherapy).  Emergency epinephrine injection (auto-injector) in case of an allergic emergency. This is a  self-injectable, pre-measured medicine that must be given within the first few minutes of a serious allergic reaction. Follow these instructions at home:         Avoid known allergens whenever possible.  If you suffer from airborne allergens, wash out your nose daily. You can do this with a saline spray or a neti pot to flush out your nose (nasal irrigation).  Take over-the-counter and prescription medicines only as told by your health care provider.  Keep all follow-up visits as told by your health care provider. This is important.  If you are at risk of a severe allergic reaction (anaphylaxis), keep your auto-injector with you at all times.  If you have ever had anaphylaxis, wear a medical alert bracelet or necklace that states you have a severe allergy. Contact a health care provider if:  Your symptoms do not improve with treatment. Get help right away if:  You have symptoms of anaphylaxis, such as: ? Swollen mouth, tongue, or throat. ? Pain or tightness in your chest. ? Trouble breathing or shortness of breath. ? Dizziness or fainting. ? Severe abdominal pain, vomiting, or diarrhea. This information is not intended to replace advice given to you by your health care provider. Make sure you discuss any questions you have with your health care provider. Document Revised: 04/24/2017 Document Reviewed: 08/17/2015 Elsevier Patient Education  2020 [employer]. Hypothyroidism  Hypothyroidism is when the thyroid gland does not make enough of certain hormones (it is underactive). The thyroid gland is a small gland located in the lower front part of the neck, just in front of the windpipe (trachea). This gland makes hormones that help control how the body uses food for energy (metabolism) as well as how the heart and brain function. These hormones also play a role in keeping your bones strong. When the thyroid is underactive, it produces too little of the hormones thyroxine (T4) and  triiodothyronine (T3). What are the causes? This condition may be caused by:  Hashimoto's disease. This is a disease in which the body's disease-fighting system (immune system) attacks the thyroid gland. This is the most common cause.  Viral infections.  Pregnancy.  Certain medicines.  Birth defects.  Past radiation treatments to the head or neck for cancer.  Past treatment with radioactive iodine.  Past exposure to radiation in the environment.  Past surgical removal of part or all of the thyroid.  Problems with a gland in the center of the brain (pituitary gland).  Lack of enough iodine in the diet. What increases the risk? You are more likely to develop this condition if:  You are female.  You have a family history of thyroid conditions.  You use a medicine called lithium.  You take medicines that affect the immune system (immunosuppressants). What are the signs or symptoms? Symptoms of this condition include:  Feeling as though you have no energy (lethargy).  Not being able to tolerate cold.  Weight gain that is not explained by a change in diet or exercise habits.  Lack of appetite.  Dry skin.  Coarse hair.  Menstrual irregularity.  Slowing of thought processes.  Constipation.  Sadness or depression. How is this diagnosed?  This condition may be diagnosed based on:  Your symptoms, your medical history, and a physical exam.  Blood tests. You may also have imaging tests, such as an ultrasound or MRI. How is this treated? This condition is treated with medicine that replaces the thyroid hormones that your body does not make. After you begin treatment, it may take several weeks for symptoms to go away. Follow these instructions at home:  Take over-the-counter and prescription medicines only as told by your health care provider.  If you start taking any new medicines, tell your health care provider.  Keep all follow-up visits as told by your  health care provider. This is important. ? As your condition improves, your dosage of thyroid hormone medicine may change. ? You will need to have blood tests regularly so that your health care provider can monitor your condition. Contact a health care provider if:  Your symptoms do not get better with treatment.  You are taking thyroid replacement medicine and you: ? Sweat a lot. ? Have tremors. ? Feel anxious. ? Lose weight rapidly. ? Cannot tolerate heat. ? Have emotional swings. ? Have diarrhea. ? Feel weak. Get help right away if you have:  Chest pain.  An irregular heartbeat.  A rapid heartbeat.  Difficulty breathing. Summary  Hypothyroidism is when the thyroid gland does not make enough of certain hormones (it is underactive).  When the thyroid is underactive, it produces too little of the hormones thyroxine (T4) and triiodothyronine (T3).  The most common cause is Hashimoto's disease, a disease in which the body's disease-fighting system (immune system) attacks the thyroid gland. The condition can also be caused by viral infections, medicine, pregnancy, or past radiation treatment to the head or neck.  Symptoms may include weight gain, dry skin, constipation, feeling as though you do not have energy, and not being able to tolerate cold.  This condition is treated with medicine to replace the thyroid hormones that your body does not make. This information is not intended to replace advice given to you by your health care provider. Make sure you discuss any questions you have with your health care provider. Document Revised: 01/11/2017 Document Reviewed: 01/09/2017 Elsevier Patient Education  2020 [employer].

## 2019-06-05 ENCOUNTER — Other Ambulatory Visit: Payer: 59

## 2019-06-05 ENCOUNTER — Telehealth: Payer: Self-pay | Admitting: Physician Assistant

## 2019-06-05 DIAGNOSIS — J3089 Other allergic rhinitis: Secondary | ICD-10-CM

## 2019-06-05 DIAGNOSIS — E039 Hypothyroidism, unspecified: Secondary | ICD-10-CM

## 2019-06-05 NOTE — Telephone Encounter (Signed)
Patient called apologized for missing 4/23 lab appt & rescheduled for 4/26, she also states was so out of it/ sick @ Wednesday's OV can't remember instructions regarding  New cholesterol Rx ( should she quit taking the old Rx & start the new --Please advise.  --Forwarding message to med asst , she will be coming 10:15 On Mon 06/08/19.   -glh

## 2019-06-08 ENCOUNTER — Other Ambulatory Visit: Payer: 59

## 2019-06-08 NOTE — Telephone Encounter (Signed)
Hyperlipidemia: The 10-year ASCVD risk score Denman George DC Jr., et al., 2013) is: 16.8%   Values used to calculate the score:     Age: 54 years     Sex: Female     Is Non-Hispanic African American: No     Diabetic: No     Tobacco smoker: Yes     Systolic Blood Pressure: 151 mmHg     Is BP treated: Yes     HDL Cholesterol: 37 mg/dL     Total Cholesterol: 272 mg/dL - Pt reports compliance with atorvastatin 20 mg and feels like her cholesterol is elevated because her thyroid is not well controlled. - Hypothyroid can be a secondary cause of hyperlipidemia and thyroid needs to be under better control. - Discussed the importance of reducing cholesterol to decrease risk of coronary artery disease.  - ASCVD score is 16% and pt is agreeable to changing to a higher intensity statin. Discontinued atorvastatin 20 mg and started rosuvastatin 20 mg.  - Recheck lipid panel and hepatic function in 6 weeks. - Follow heart healthy diet and stay as active as possible.  Called patient to advise to d/c Atorvastatin 20mg  per last office visit and to start Crestor 20mg .   Patient answered and states she is unable to talk to me at this moment bc shes waiting on a tow truck and will call back later for instructions. AS, CMA

## 2019-06-09 ENCOUNTER — Other Ambulatory Visit: Payer: Self-pay | Admitting: Physician Assistant

## 2019-06-09 MED ORDER — LISINOPRIL 5 MG PO TABS: 5.0000 mg | ORAL_TABLET | Freq: Every day | ORAL | 0 refills | Status: AC

## 2019-06-09 NOTE — Telephone Encounter (Signed)
Patient requesting refill of Lisinopril to be sent to [facility] in [place]. AS, CMA

## 2019-06-09 NOTE — Telephone Encounter (Signed)
Called patient back to notify to d/c Atorvastatin and start Crestor. Patient verbalized understanding on this.   Patient requesting refill on Levothyroxine. Patient has not come back in for T4 free or T3 (due to transportation issues). Patient does have [place] scheduled for 06/17/19. She does not have Endo apt scheduled at this time but they have reached out to her to schedule this apt.   Patient is also requesting a steroid rx due to allergy issues. Please advise.

## 2019-06-10 MED ORDER — LEVOTHYROXINE SODIUM 75 MCG PO TABS: 75.0000 ug | ORAL_TABLET | Freq: Every day | ORAL | 1 refills | Status: DC

## 2019-06-10 MED ORDER — PREDNISONE 20 MG PO TABS: ORAL_TABLET | ORAL | 0 refills | Status: DC

## 2019-06-10 NOTE — Telephone Encounter (Signed)
Patient is aware of the below and verbalized understanding. I gave her Dr. Althea Charon phone number so that she can call to set up apt. AS, CMA

## 2019-06-17 ENCOUNTER — Ambulatory Visit
Admission: RE | Admit: 2019-06-17 | Discharge: 2019-06-17 | Disposition: A | Discharge status: Home / Self Care | Payer: 59 | Source: Ambulatory Visit | Attending: Physician Assistant | Admitting: Physician Assistant

## 2019-06-17 DIAGNOSIS — E039 Hypothyroidism, unspecified: Secondary | ICD-10-CM

## 2019-06-22 ENCOUNTER — Other Ambulatory Visit: Payer: Self-pay

## 2019-06-23 NOTE — Progress Notes (Signed)
Patient ID: Morgan Wong, female   DOB: 19-May-1965, 54 y.o.   MRN: [id number]   This visit occurred during the SARS-CoV-2 public health emergency.  Safety protocols were in place, including screening questions prior to the visit, additional usage of staff PPE, and extensive cleaning of exam room while observing appropriate contact time as indicated for disinfecting solutions.   HPI  Morgan Wong is a 54 y.o.-year-old female, referred by her PCP, Lorrene Reid, PA-C, for management of hypothyroidism.  Pt. has been dx with hypothyroidism in 1994 >> on Levothyroxine 100 mcg mostly, since then.  After moving to [place], [place], in 2018 >> she came off the LT4 for 1 year >> TSH increased. She then restarted LT4 100 mcg. However, she was off the medication again in 2020 for few months and a TSH level returned high in 02/2019.  She then restarted levothyroxine at a lower dose, 50 mcg daily.  TSH remained high in 05/14/2019. The LT4 dose was increased to 75 mcg then.  She continues on this dose. She was on Levoxyl DAW in the past >> felt much better but this stopped being covered by insurance.  She takes the thyroid hormone: - fasting - with water - separated by 2h from b'fast  - no calcium, iron, PPIs, multivitamins  - no Biotin  I reviewed pt's thyroid tests: Lab Results  Component Value Date   TSH 18.400 (H) 05/14/2019   TSH 18.300 (H) 03/03/2019   TSH 11.530 (H) 08/27/2016   Antithyroid antibodies: No results found for: THGAB No components found for: TPOAB  Pt describes: - + weight gain - + fatigue - + mental fog - + cold (and heat) intolerance - depression - constipation - dry skin - hair loss  She also has a history of a small thyroid nodule, and had a thyroid ultrasound checked after she complains of enlarged thyroid.  Thyroid ultrasound (06/17/2019): Thyroid with one small nodule or pseudonodule measuring 0.9 x 0.9 x 0.8 cm.  Recommendation was for rechecking ultrasound in a  year.      Pt mentions + hoarseness, + dysphagia/no odynophagia, SOB with lying down. Her R side of her neck is now tender (she points towards the upper right cervical region.  She has no FH of thyroid disorders. + FH of thyroid cancer in MGM and paternal first cousin.  No h/o radiation tx to head or neck. No recent use of iodine supplements. She just finished a prednisone taper yesterday.  Pt. also has a history of fibromyalgia, systemic vasculitis, Raynaud's sd., vit D def. (on 2000-5000 units daily), HL-on rosuvastatin 20, HTN-on lisinopril 5, DDD and DJD, history of heavy metal contamination. On B12.  She has a family history of genetic primary pulmonary hypertension.  5 members of the family have this.  Her son is the youngest member of the family with this condition.  ROS: Constitutional: + See HPI, + nosebleeds Eyes: no blurry vision, no xerophthalmia ENT: no sore throat, + see HPI, + congestion Cardiovascular: + All CP/SOB/palpitations/leg swelling Respiratory: + Bowels cough/SOB Gastrointestinal: + All N/V/D/C/acid reflux Musculoskeletal: + Both muscle/joint aches Skin: no rashes, + easy bruising Neurological: no tremors/numbness/tingling/dizziness Psychiatric: + Both mild: Depression/anxiety  Past Medical History:  Diagnosis Date  . Fibromyalgia   . Heavy metal poisoning   . Multilevel degenerative disc disease   . Raynaud disease   . Systemic vasculitis ([place])   . Thyroid disease    Past Surgical History:  Procedure Laterality Date  .  ABDOMINAL HYSTERECTOMY    . APPENDECTOMY    . CYST EXCISION  2014  . CYST EXCISION  2002   removed from behind ear   Social History   Socioeconomic History  . Marital status: Married    Spouse name: Not on file  . Number of children: 2  . Years of education: Not on file  . Highest education level: Not on file  Occupational History  . Occupation: Disabled  Tobacco Use  . Smoking status: Current Every Day Smoker     Types: Cigarettes    Start date: 08/27/1981  . Smokeless tobacco: Never Used  Substance and Sexual Activity  . Alcohol use: Yes    Comment: yearly  . Drug use: No  . Sexual activity: Yes    Partners: Male    Birth control/protection: None  Other Topics Concern  . Not on file  Social History Narrative  . Not on file   Social Determinants of Health   Financial Resource Strain:   . Difficulty of Paying Living Expenses:   Food Insecurity:   . Worried About Programme researcher, broadcasting/film/video in the Last Year:   . Barista in the Last Year:   Transportation Needs:   . Freight forwarder (Medical):   Marland Kitchen Lack of Transportation (Non-Medical):   Physical Activity:   . Days of Exercise per Week:   . Minutes of Exercise per Session:   Stress:   . Feeling of Stress :   Social Connections:   . Frequency of Communication with Friends and Family:   . Frequency of Social Gatherings with Friends and Family:   . Attends Religious Services:   . Active Member of Clubs or Organizations:   . Attends Banker Meetings:   Marland Kitchen Marital Status:   Intimate Partner Violence:   . Fear of Current or Ex-Partner:   . Emotionally Abused:   Marland Kitchen Physically Abused:   . Sexually Abused:    Current Outpatient Medications on File Prior to Visit  Medication Sig Dispense Refill  . azelastine (ASTELIN) 0.1 % nasal spray Place 1-2 sprays into both nostrils 2 (two) times daily. Use in each nostril as directed 30 mL 1  . ibuprofen (ADVIL) 200 MG tablet Take 1,000 mg by mouth 2 (two) times daily.    Marland Kitchen levothyroxine (SYNTHROID) 75 MCG tablet Take 1 tablet (75 mcg total) by mouth daily. 30 tablet 1  . lisinopril (ZESTRIL) 5 MG tablet Take 1 tablet (5 mg total) by mouth daily. 90 tablet 0  . nicotine (NICODERM CQ) 14 mg/24hr patch Place 1 patch (14 mg total) onto the skin daily. 42 patch 0  . olopatadine (PATANOL) 0.1 % ophthalmic solution Place 1 drop into both eyes 2 (two) times daily. 5 mL 1  . rosuvastatin  (CRESTOR) 20 MG tablet Take 1 tablet (20 mg total) by mouth daily. 90 tablet 1  . Vitamin D, Cholecalciferol, 1000 units TABS Take 3 Units by mouth.    . Vitamin D, Ergocalciferol, (DRISDOL) 1.25 MG (50000 UNIT) CAPS capsule Take 1 capsule (50,000 Units total) by mouth every 7 (seven) days. 16 capsule 0   No current facility-administered medications on file prior to visit.   Allergies  Allergen Reactions  . Amoxicillin Anaphylaxis    Cannot take any "Cillin" drugs  . Penicillins Anaphylaxis  . Other     Vicryl/Monocryl Infection like feeling/burning/fever  . Procardia [Nifedipine] Swelling  . Oxycodone-Acetaminophen    Family History  Problem Relation Age  of Onset  . Hypertension Mother   . Heart disease Mother   . Lumbar disc disease Mother   . Emphysema Mother   . Liver cancer Father   . Lung cancer Father   . Heart disease Father   . Coronary artery disease Sister   . COPD Sister   . Hypertension Sister   . Hypertension Brother   . Pulmonary Hypertension Son   . Pulmonary Hypertension Maternal Uncle   . Hypertension Maternal Grandmother   . Bladder Cancer Maternal Grandmother   . Thyroid disease Maternal Grandmother   . Colon cancer Paternal Grandfather   . Cancer Paternal Grandfather   . Emphysema Paternal Grandfather    PE: BP 130/88   Pulse 68   Ht 5\' 8"  (1.727 m)   Wt 174 lb (78.9 kg)   SpO2 97%   BMI 26.46 kg/m  Wt Readings from Last 3 Encounters:  06/24/19 174 lb (78.9 kg)  06/03/19 170 lb 1.6 oz (77.2 kg)  03/25/19 162 lb 4.8 oz (73.6 kg)   Constitutional: overweight, in NAD Eyes: PERRLA, EOMI, no exophthalmos ENT: moist mucous membranes, no thyromegaly, no nodules palpated, + right-sided upper cervical lymphadenopathy Cardiovascular: RRR, No MRG Respiratory: CTA B Gastrointestinal: abdomen soft, NT, ND, BS+ Musculoskeletal: no deformities, strength intact in all 4 Skin: moist, warm, no rashes Neurological: no tremor with outstretched hands, DTR  normal in all 4  ASSESSMENT: 1.  Uncontrolled hypothyroidism  2.  Thyroid nodule  PLAN:  1. Patient with long-standing hypothyroidism, on levothyroxine therapy.  For years, she has been on 100 mcg daily and she feels that this is a good dose for her.  She has been off medication with subsequently high TSH in 2018 after her moved to [place], and again in 2020.  She was started back on levothyroxine, but only on 50 mcg daily and the TSH remained elevated.  At that time, a month ago, she was advised to increase the dose to 75 mcg daily.  She feels that this is not enough for her - she has multiple hypothyroid complaints including weight gain, fatigue, mental fog - We discussed about correct intake of levothyroxine, fasting, with water, separated by at least 30 minutes from breakfast, and separated by more than 4 hours from calcium, iron, multivitamins, acid reflux medications (PPIs).  She is taking it correctly. - We cannot check her TFTs today since she just finished a prednisone taper yesterday. - However, I agree with her that she will need a higher dose of levothyroxine based on body weight and history of her hypothyroidism.  We will increase the dose to 100 mcg daily at this visit.  Since she felt better on brand name Levoxyl before, I will send this to her pharmacy.  We discussed that if this is not covered by insurance, we can try to avoid the eyes by using white tablets of Levothyroxine (50 mcg) and take 2 tablets at the same time. - she will need to return in ~5 weeks for labs - I will see her back in 4 months  2.  Thyroid nodule -Seen on ultrasound -She felt an enlarged thyroid, but I explained that this is unlikely to be related to the nodule and the nodule was most likely an incidental finding -Nodule appears hypoechoic, and per my review of the images, has some irregular contours and microcalcifications versus colloid granules.  This was read as a nodule or a pseudonodule  (inflammatory region), however, repeat ultrasound was recommended at  1 year.  I would have a low threshold to biopsy this nodule after we get the new ultrasound in 6 months from the previous, especially due to family history of thyroid cancer and the fact that she is very concerned about this nodule -No intervention needed for now but will order the ultrasound at next visit  Orders Placed This Encounter  Procedures  . TSH  . T4, free  . T3, free    Carlus Pavlov, MD PhD [facility] End[facility]

## 2019-06-24 ENCOUNTER — Ambulatory Visit: Payer: 59 | Admitting: Internal Medicine

## 2019-06-24 ENCOUNTER — Other Ambulatory Visit: Payer: Self-pay

## 2019-06-24 ENCOUNTER — Encounter: Payer: Self-pay | Admitting: Internal Medicine

## 2019-06-24 VITALS — BP 130/88 | HR 68 | Ht 68.0 in | Wt 174.0 lb

## 2019-06-24 DIAGNOSIS — E039 Hypothyroidism, unspecified: Secondary | ICD-10-CM | POA: Diagnosis not present

## 2019-06-24 DIAGNOSIS — E041 Nontoxic single thyroid nodule: Secondary | ICD-10-CM

## 2019-06-24 MED ORDER — LEVOXYL 100 MCG PO TABS: 100.0000 ug | ORAL_TABLET | Freq: Every day | ORAL | 3 refills | Status: AC

## 2019-06-24 NOTE — Patient Instructions (Addendum)
Please increase Levothyroxine 100 mcg daily.  Take the thyroid hormone every day, with water, at least 30 minutes before breakfast, separated by at least 4 hours from: - acid reflux medications - calcium - iron - multivitamins  Please come back for labs in 5 week.  Please come back for a follow-up appointment in 4 months.

## 2019-07-07 ENCOUNTER — Encounter: Payer: 59 | Admitting: Physician Assistant

## 2019-07-30 ENCOUNTER — Other Ambulatory Visit (INDEPENDENT_AMBULATORY_CARE_PROVIDER_SITE_OTHER): Payer: 59

## 2019-07-30 ENCOUNTER — Other Ambulatory Visit: Payer: Self-pay

## 2019-07-30 DIAGNOSIS — E039 Hypothyroidism, unspecified: Secondary | ICD-10-CM | POA: Diagnosis not present

## 2019-07-30 LAB — T4, FREE: Free T4: 1.1 ng/dL (ref 0.60–1.60)

## 2019-07-30 LAB — T3, FREE: T3, Free: 3.2 pg/mL (ref 2.3–4.2)

## 2019-07-30 LAB — TSH: TSH: 3.13 u[IU]/mL (ref 0.35–4.50)

## 2019-11-04 ENCOUNTER — Ambulatory Visit: Payer: 59 | Admitting: Internal Medicine

## 2019-11-04 ENCOUNTER — Other Ambulatory Visit: Payer: Self-pay

## 2019-11-04 ENCOUNTER — Ambulatory Visit
Admission: EM | Admit: 2019-11-04 | Discharge: 2019-11-04 | Disposition: A | Discharge status: Home / Self Care | Payer: 59 | Attending: Emergency Medicine | Admitting: Emergency Medicine

## 2019-11-04 DIAGNOSIS — B372 Candidiasis of skin and nail: Secondary | ICD-10-CM | POA: Diagnosis not present

## 2019-11-04 MED ORDER — NYSTATIN 100000 UNIT/GM EX POWD
1.0000 | Freq: Three times a day (TID) | CUTANEOUS | 0 refills | Status: AC
Start: 2019-11-04 — End: ?

## 2019-11-04 MED ORDER — FLUCONAZOLE 150 MG PO TABS: 150.0000 mg | ORAL_TABLET | Freq: Once | ORAL | 0 refills | Status: AC

## 2019-11-04 MED ORDER — NYSTATIN 100000 UNIT/GM EX CREA
TOPICAL_CREAM | CUTANEOUS | 0 refills | Status: AC
Start: 2019-11-04 — End: ?

## 2019-11-04 NOTE — ED Triage Notes (Signed)
Pt states that she was out of town for a family issue and was unable to use her pads for leakage as she usually does. Pt states her rash/irritation has become worse and has gain little if any improvement from them. Pt is aox4 and ambulatory.

## 2019-11-04 NOTE — Discharge Instructions (Signed)
Take 1 tablet of Diflucan today, repeat in 72 hours Nystatin cream or powder 2-3 times daily over the next 1 to 2 weeks Continue to keep area clean and dry Follow-up if not improving or worsening

## 2019-11-04 NOTE — ED Provider Notes (Signed)
[facility]    CSN: [id number] Arrival date & time: 11/04/19  1416      History   Chief Complaint Chief Complaint  Patient presents with   Rash    x 2 weeks    HPI ANEA FODERA is a 54 y.o. female presenting today for evaluation of a rash.  Patient reports over the past 3 weeks she has had burning swelling and itching to her labial area.  Reports symptoms began after she was out of town and reports she was unable to change pads from urinary incontinence and let genital area air out as she normally would.  She has used OTC methods of Epson salt, baking soda, Desitin and Butt paste without relief.    HPI  Past Medical History:  Diagnosis Date   Fibromyalgia    Heavy metal poisoning    Multilevel degenerative disc disease    Raynaud disease    Systemic vasculitis ([facility])    Thyroid disease     Patient Active Problem List   Diagnosis Date Noted   Hyperlipemia 03/25/2019   HTN, goal below 130/80 03/03/2019   Pain in right hand 10/10/2017   Swelling of right hand 10/10/2017   Numbness and tingling in right hand 10/10/2017   Numbness and tingling of right arm 10/10/2017   Fibromyalgia 08/28/2016   Heavy metal poisoning 08/28/2016   Hypothyroidism 08/27/2016   Fatigue 08/27/2016   Healthcare maintenance 08/27/2016    Past Surgical History:  Procedure Laterality Date   ABDOMINAL HYSTERECTOMY     APPENDECTOMY     CYST EXCISION  2014   CYST EXCISION  2002   removed from behind ear    OB History   No obstetric history on file.      Home Medications    Prior to Admission medications   Medication Sig Start Date End Date Taking? Authorizing Provider  azelastine (ASTELIN) 0.1 % nasal spray Place 1-2 sprays into both nostrils 2 (two) times daily. Use in each nostril as directed 06/03/19   Mayer Masker, PA-C  fluconazole (DIFLUCAN) 150 MG tablet Take 1 tablet (150 mg total) by mouth once for 1 dose. 11/04/19 11/04/19  Zeplin Aleshire,  [place] Folden C, PA-C  ibuprofen (ADVIL) 200 MG tablet Take 1,000 mg by mouth 2 (two) times daily.    [provider]  LEVOXYL 100 MCG tablet Take 1 tablet (100 mcg total) by mouth daily before breakfast. 06/24/19   Carlus Pavlov, MD  lisinopril (ZESTRIL) 5 MG tablet Take 1 tablet (5 mg total) by mouth daily. 06/09/19   Mayer Masker, PA-C  nicotine (NICODERM CQ) 14 mg/24hr patch Place 1 patch (14 mg total) onto the skin daily. 03/03/19   Danford, Orpha Bur D, NP  nystatin (MYCOSTATIN/NYSTOP) powder Apply 1 application topically 3 (three) times daily. 11/04/19   Katielynn Horan C, PA-C  nystatin cream (MYCOSTATIN) Apply to affected area 2 times daily 11/04/19   Trevion Hoben C, PA-C  olopatadine (PATANOL) 0.1 % ophthalmic solution Place 1 drop into both eyes 2 (two) times daily. 06/03/19   Mayer Masker, PA-C  rosuvastatin (CRESTOR) 20 MG tablet Take 1 tablet (20 mg total) by mouth daily. 06/03/19   Mayer Masker, PA-C  Vitamin D, Cholecalciferol, 1000 units TABS Take 3 Units by mouth.    [provider]  Vitamin D, Ergocalciferol, (DRISDOL) 1.25 MG (50000 UNIT) CAPS capsule Take 1 capsule (50,000 Units total) by mouth every 7 (seven) days. 03/05/19   Julaine Fusi, NP    Family  History Family History  Problem Relation Age of Onset   Hypertension Mother    Heart disease Mother    Lumbar disc disease Mother    Emphysema Mother    Liver cancer Father    Lung cancer Father    Heart disease Father    Coronary artery disease Sister    COPD Sister    Hypertension Sister    Hypertension Brother    Pulmonary Hypertension Son    Pulmonary Hypertension Maternal Uncle    Hypertension Maternal Grandmother    Bladder Cancer Maternal Grandmother    Thyroid disease Maternal Grandmother    Colon cancer Paternal Grandfather    Cancer Paternal Grandfather    Emphysema Paternal Grandfather     Social History Social History   Tobacco Use   Smoking status: Current  Every Day Smoker    Types: Cigarettes    Start date: 08/27/1981   Smokeless tobacco: Never Used  Vaping Use   Vaping Use: Never used  Substance Use Topics   Alcohol use: Yes    Comment: yearly   Drug use: No     Allergies   Amoxicillin, Penicillins, Other, Procardia [nifedipine], and Oxycodone-acetaminophen   Review of Systems Review of Systems  Constitutional: Negative for fatigue and fever.  Eyes: Negative for visual disturbance.  Respiratory: Negative for shortness of breath.   Cardiovascular: Negative for chest pain.  Gastrointestinal: Negative for abdominal pain, nausea and vomiting.  Musculoskeletal: Negative for arthralgias and joint swelling.  Skin: Positive for color change and rash. Negative for wound.  Neurological: Negative for dizziness, weakness, light-headedness and headaches.     Physical Exam Triage Vital Signs ED Triage Vitals  Enc Vitals Group     BP 11/04/19 1445 (!) 152/100     Pulse Rate 11/04/19 1445 76     Resp 11/04/19 1445 18     Temp 11/04/19 1445 98.1 F (36.7 C)     Temp Source 11/04/19 1445 Oral     SpO2 11/04/19 1445 97 %     Weight --      Height --      Head Circumference --      Peak Flow --      Pain Score 11/04/19 1446 5     Pain Loc --      Pain Edu? --      Excl. in GC? --    No data found.  Updated Vital Signs BP (!) 152/100 (BP Location: Left Arm)    Pulse 76    Temp 98.1 F (36.7 C) (Oral)    Resp 18    SpO2 97%   Visual Acuity Right Eye Distance:   Left Eye Distance:   Bilateral Distance:    Right Eye Near:   Left Eye Near:    Bilateral Near:     Physical Exam Vitals and nursing note reviewed.  Constitutional:      Appearance: She is well-developed.     Comments: No acute distress  HENT:     Head: Normocephalic and atraumatic.     Nose: Nose normal.  Eyes:     Conjunctiva/sclera: Conjunctivae normal.  Cardiovascular:     Rate and Rhythm: Normal rate.  Pulmonary:     Effort: Pulmonary effort is  normal. No respiratory distress.  Abdominal:     General: There is no distension.  Genitourinary:    Comments: Labia majora and minora with erythema and swelling, no induration or fluctuance Musculoskeletal:  General: Normal range of motion.     Cervical back: Neck supple.  Skin:    General: Skin is warm and dry.  Neurological:     Mental Status: She is alert and oriented to person, place, and time.      UC Treatments / Results  Labs (all labs ordered are listed, but only abnormal results are displayed) Labs Reviewed - No data to display  EKG   Radiology No results found.  Procedures Procedures (including critical care time)  Medications Ordered in UC Medications - No data to display  Initial Impression / Assessment and Plan / UC Course  I have reviewed the triage vital signs and the nursing notes.  Pertinent labs & imaging results that were available during my care of the patient were reviewed by me and considered in my medical decision making (see chart for details).     Treating for yeast with nystatin topical and Diflucan orally.  Continue to keep clean and let air out.  Discussed strict return precautions. Patient verbalized understanding and is agreeable with plan.  Final Clinical Impressions(s) / UC Diagnoses   Final diagnoses:  Yeast dermatitis     Discharge Instructions     Take 1 tablet of Diflucan today, repeat in 72 hours Nystatin cream or powder 2-3 times daily over the next 1 to 2 weeks Continue to keep area clean and dry Follow-up if not improving or worsening    ED Prescriptions    Medication Sig Dispense Auth. Provider   nystatin cream (MYCOSTATIN) Apply to affected area 2 times daily 30 g Dietrich Samuelson C, PA-C   nystatin (MYCOSTATIN/NYSTOP) powder Apply 1 application topically 3 (three) times daily. 15 g Joaquim Tolen C, PA-C   fluconazole (DIFLUCAN) 150 MG tablet Take 1 tablet (150 mg total) by mouth once for 1 dose. 2  tablet Kimberly Coye, [place] C, PA-C     PDMP not reviewed this encounter.   Lew Dawes, [place] 11/04/19 1534

## 2019-12-18 ENCOUNTER — Ambulatory Visit: Payer: 59 | Admitting: Internal Medicine

## 2020-09-24 IMAGING — US US THYROID
1 series · 13 of 25 positions shown · non-contrast
Comparison: None.

CLINICAL DATA: Hypothyroid.

EXAM:
THYROID ULTRASOUND
TECHNIQUE: Ultrasound examination of the thyroid gland and adjacent soft
tissues was performed.

[Series 1: us thyroid · 0.05mm/px · 13 of 98 slices shown]
[im 1/98]
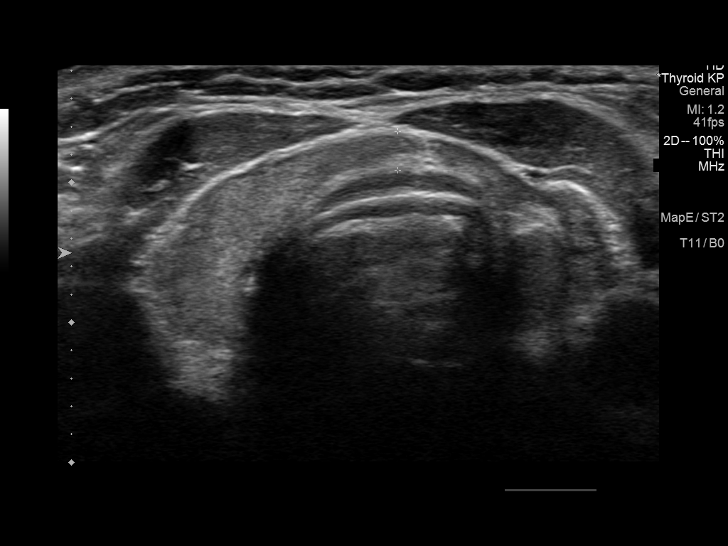
[im 9/98]
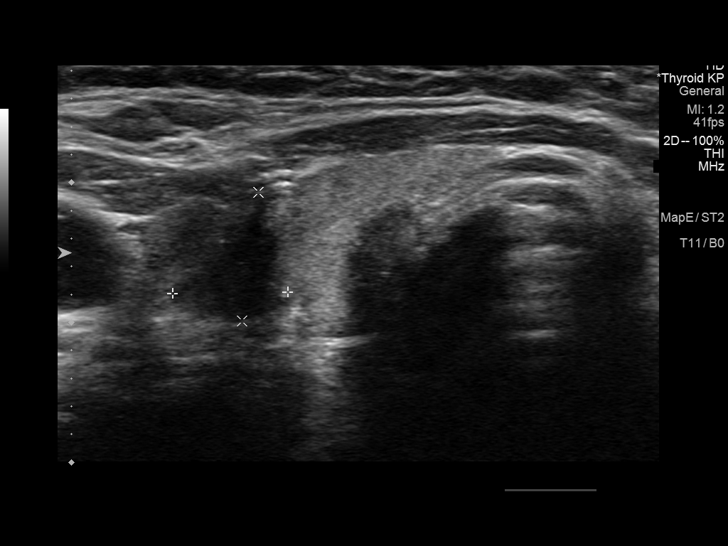
[im 17/98]
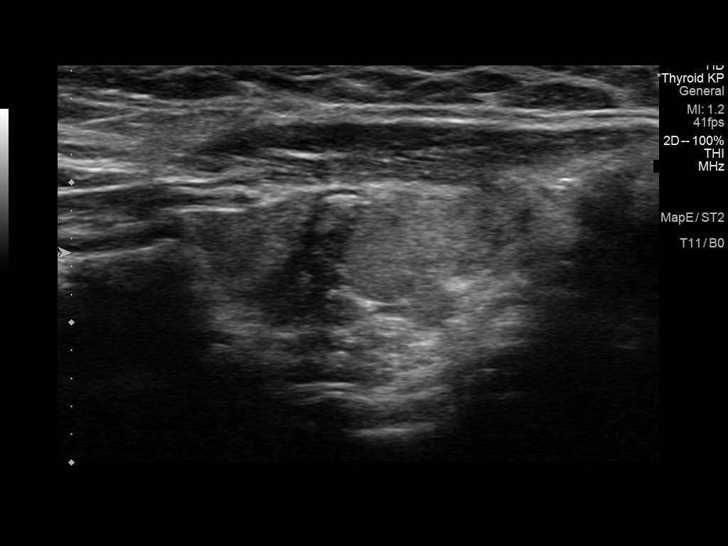
[im 25/98]
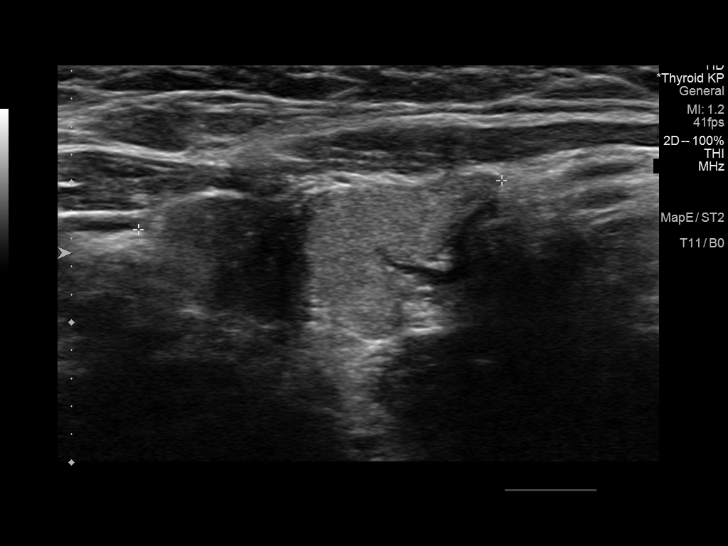
[im 33/98]
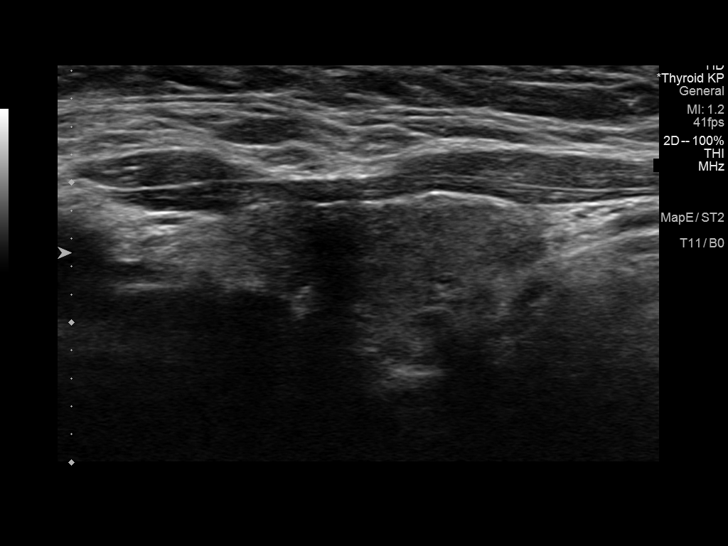
[im 41/98]
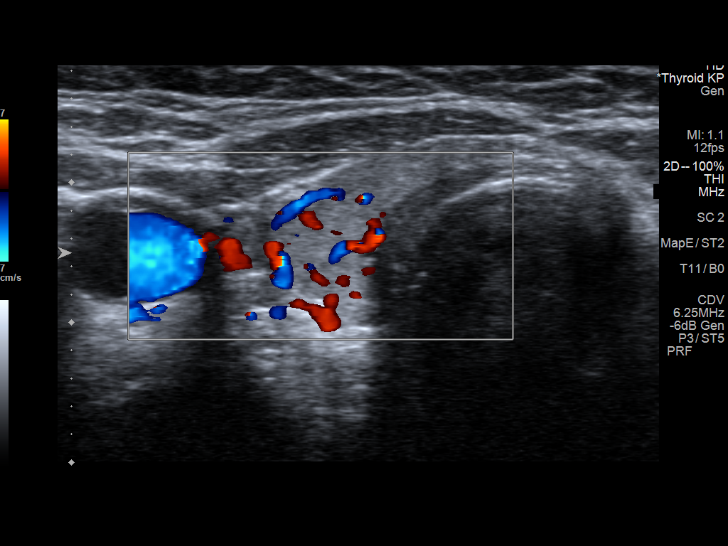
[im 49/98]
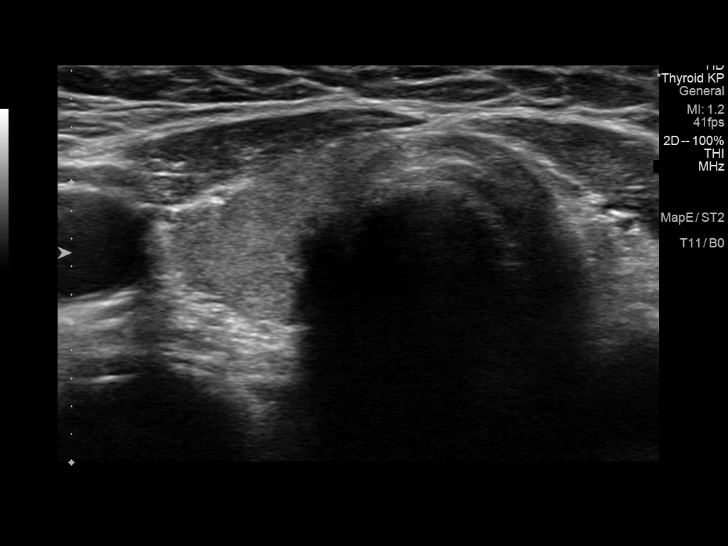
[im 57/98]
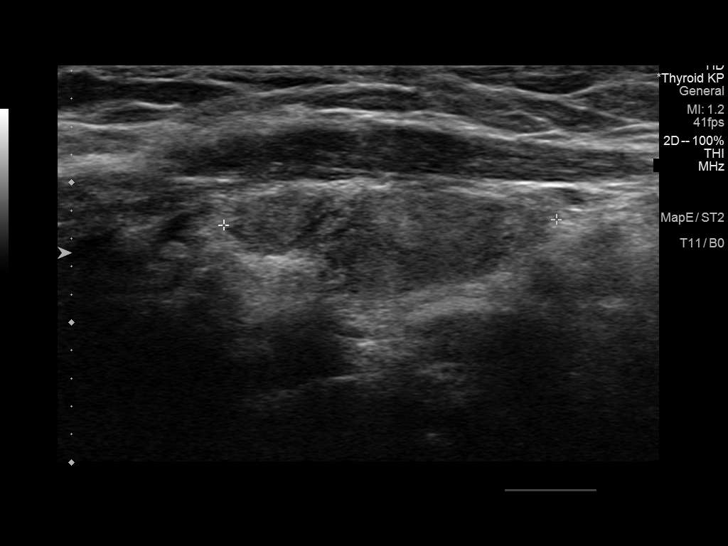
[im 65/98]
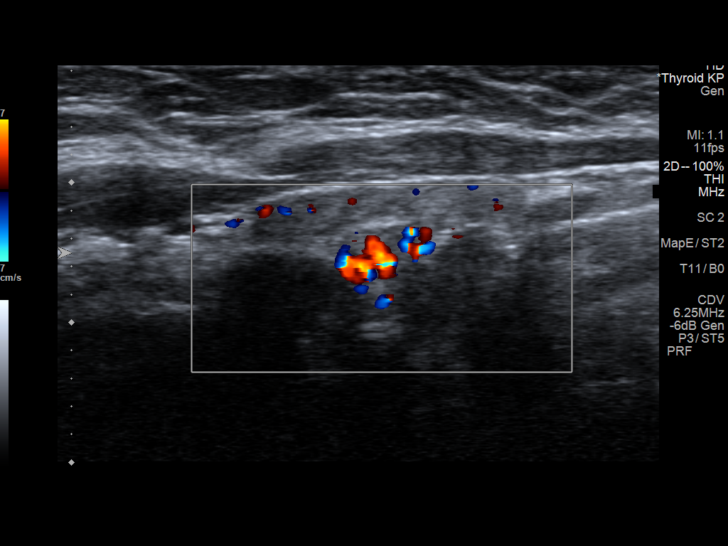
[im 73/98]
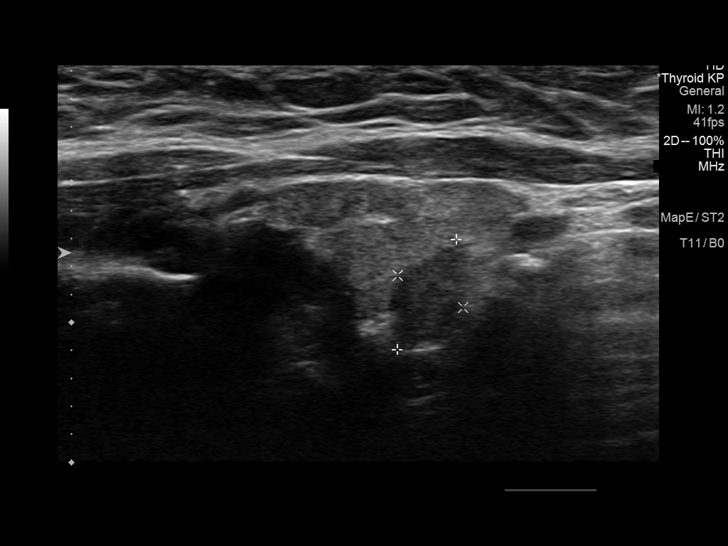
[im 81/98]
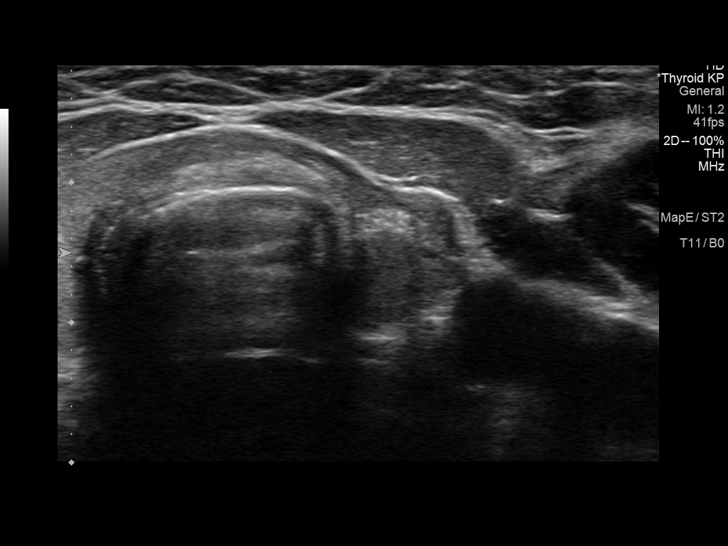
[im 89/98]
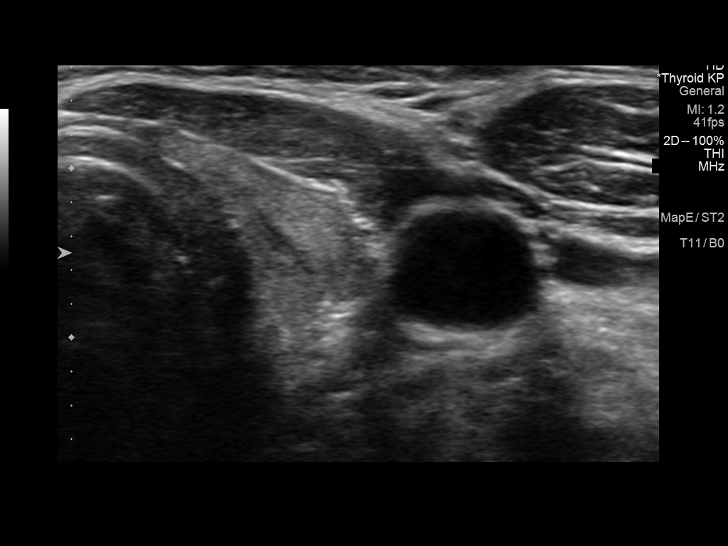
[im 98/98]
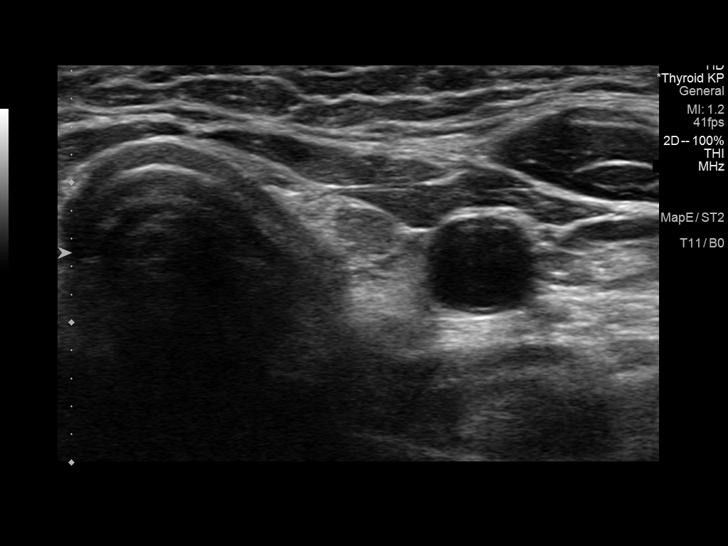

[13 of 25 positions shown; findings below may reference images not displayed]

FINDINGS: Parenchymal Echotexture: Mildly heterogenous

Isthmus: 0.4 cm

Right lobe: 2.6 x 0.9 x 1.0 cm

Left lobe: 2.6 x 0.9 x 0.9 cm

_________________________________________________________

Estimated total number of nodules >/= 1 cm: 0

Number of spongiform nodules >/=  2 cm not described below (TR1): 0

Number of mixed cystic and solid nodules >/= 1.5 cm not described
below (TR2): 0

_________________________________________________________

Nodule # 1:

Location: Right; Superior

Maximum size: 0.9 cm; Other 2 dimensions: 0.9 x 0.8 cm

Composition: cannot determine (2)

Echogenicity: very hypoechoic (3)

Shape: not taller-than-wide (0)

Margins: ill-defined (0)

Echogenic foci: punctate echogenic foci (3)

ACR TI-RADS total points: 8.

ACR TI-RADS risk category: TR5 (>/= 7 points).

ACR TI-RADS recommendations:

*Given size (>/= 0.5 - 0.9 cm) and appearance, a follow-up
ultrasound in 1 year should be considered based on TI-RADS criteria.

_________________________________________________________
IMPRESSION: 1. Questionable TI-RADS category 5 (suspicious) nodule versus pseudo
nodule in the right upper gland. This lesion meets criteria for
surveillance. Recommend follow-up ultrasound in 1 year.

The above is in keeping with the ACR TI-RADS recommendations - [HOSPITAL] 1406;[DATE].
